# Patient Record
Sex: Male | Born: 1960
Health system: Southern US, Community
[De-identification: ages and names within clinical notes are randomized; demographics above are authoritative.]

## PROBLEM LIST (undated history)

## (undated) DIAGNOSIS — I10 Essential (primary) hypertension: Secondary | ICD-10-CM

## (undated) DIAGNOSIS — M1990 Unspecified osteoarthritis, unspecified site: Secondary | ICD-10-CM

## (undated) DIAGNOSIS — E785 Hyperlipidemia, unspecified: Secondary | ICD-10-CM

## (undated) DIAGNOSIS — E119 Type 2 diabetes mellitus without complications: Secondary | ICD-10-CM

## (undated) DIAGNOSIS — K219 Gastro-esophageal reflux disease without esophagitis: Secondary | ICD-10-CM

## (undated) HISTORY — DX: Type 2 diabetes mellitus without complications: E11.9

## (undated) HISTORY — DX: Gastro-esophageal reflux disease without esophagitis: K21.9

## (undated) HISTORY — PX: EYE SURGERY: SHX253

## (undated) HISTORY — DX: Unspecified osteoarthritis, unspecified site: M19.90

## (undated) HISTORY — DX: Essential (primary) hypertension: I10

## (undated) HISTORY — DX: Hyperlipidemia, unspecified: E78.5

## (undated) HISTORY — PX: COLONOSCOPY: SHX174

---

## 2000-05-05 ENCOUNTER — Encounter: Admission: RE | Admit: 2000-05-05 | Discharge: 2000-08-03 | Payer: Self-pay | Admitting: Psychiatry

## 2004-10-16 ENCOUNTER — Ambulatory Visit (HOSPITAL_COMMUNITY): Admission: RE | Admit: 2004-10-16 | Discharge: 2004-10-16 | Payer: Self-pay | Admitting: *Deleted

## 2006-09-14 ENCOUNTER — Encounter (HOSPITAL_COMMUNITY): Admission: RE | Admit: 2006-09-14 | Discharge: 2006-09-15 | Payer: Self-pay | Admitting: Internal Medicine

## 2009-11-05 ENCOUNTER — Encounter (INDEPENDENT_AMBULATORY_CARE_PROVIDER_SITE_OTHER): Payer: Self-pay

## 2009-11-06 ENCOUNTER — Ambulatory Visit: Payer: Self-pay | Admitting: Gastroenterology

## 2009-11-14 ENCOUNTER — Telehealth: Payer: Self-pay | Admitting: Gastroenterology

## 2009-11-23 ENCOUNTER — Ambulatory Visit: Payer: Self-pay | Admitting: Gastroenterology

## 2009-11-28 ENCOUNTER — Encounter: Payer: Self-pay | Admitting: Gastroenterology

## 2010-10-27 ENCOUNTER — Encounter: Payer: Self-pay | Admitting: Internal Medicine

## 2010-11-05 NOTE — Miscellaneous (Signed)
Summary: Lec previsit  Clinical Lists Changes  Medications: Added new medication of MOVIPREP 100 GM  SOLR (PEG-KCL-NACL-NASULF-NA ASC-C) As per prep instructions. - Signed Rx of MOVIPREP 100 GM  SOLR (PEG-KCL-NACL-NASULF-NA ASC-C) As per prep instructions.;  #1 x 0;  Signed;  Entered by: Ulis Rias RN;  Authorized by: Rachael Fee MD;  Method used: [facility] [phone]*, [address] [place], [place], [place]  [address], Ph: [phone] or [phone], Fax: [phone] Observations: Added new observation of NKA: T (11/06/2009 15:56)    Prescriptions: MOVIPREP 100 GM  SOLR (PEG-KCL-NACL-NASULF-NA ASC-C) As per prep instructions.  #1 x 0   Entered by:   Ulis Rias RN   Authorized by:   Rachael Fee MD   Signed by:   Ulis Rias RN on 11/06/2009   Method used:   [facility]ically to        [facility] [phone]* (retail)       [address]       [place], [place]  [address]       Ph: [phone] or [phone]       Fax: [phone]   RxID:   [id number]

## 2010-11-05 NOTE — Letter (Signed)
Summary: [facility] Instructions  [facility]  [address] [place], [place] [address]   Phone: [phone]  Fax: [phone]       Aaron Ferguson    [birth year]    MRN: [id number]        Procedure Day /Date: Friday 11/23/2009     Arrival Time: 8:00 am      Procedure Time: 9:00 am     Location of Procedure:                    _ x_  [facility] (4th Floor)   PREPARATION FOR COLONOSCOPY WITH MOVIPREP   Starting 5 days prior to your procedure Sunday 2/13  do not eat nuts, seeds, popcorn, corn, beans, peas,  salads, or any raw vegetables.  Do not take any fiber supplements (e.g. Metamucil, Citrucel, and Benefiber).  THE DAY BEFORE YOUR PROCEDURE         DATE: Thursday 2/17  1.  Drink clear liquids the entire day-NO SOLID FOOD  2.  Do not drink anything colored red or purple.  Avoid juices with pulp.  No orange juice.  3.  Drink at least 64 oz. (8 glasses) of fluid/clear liquids during the day to prevent dehydration and help the prep work efficiently.  CLEAR LIQUIDS INCLUDE: Water Jello Ice Popsicles Tea (sugar ok, no milk/cream) Powdered fruit flavored drinks Coffee (sugar ok, no milk/cream) Gatorade Juice: apple, white grape, white cranberry  Lemonade Clear bullion, consomm, broth Carbonated beverages (any kind) Strained chicken noodle soup Hard Candy                             4.  In the morning, mix first dose of MoviPrep solution:    Empty 1 Pouch A and 1 Pouch B into the disposable container    Add lukewarm drinking water to the top line of the container. Mix to dissolve    Refrigerate (mixed solution should be used within 24 hrs)  5.  Begin drinking the prep at 5:00 p.m. The MoviPrep container is divided by 4 marks.   Every 15 minutes drink the solution down to the next mark (approximately 8 oz) until the full liter is complete.   6.  Follow completed prep with 16 oz of clear liquid of your choice (Nothing red or purple).   Continue to drink clear liquids until bedtime.  7.  Before going to bed, mix second dose of MoviPrep solution:    Empty 1 Pouch A and 1 Pouch B into the disposable container    Add lukewarm drinking water to the top line of the container. Mix to dissolve    Refrigerate  THE DAY OF YOUR PROCEDURE      DATE: Friday 2/18  Beginning at 4:00 am  (5 hours before procedure):         1. Every 15 minutes, drink the solution down to the next mark (approx 8 oz) until the full liter is complete.  2. Follow completed prep with 16 oz. of clear liquid of your choice.    3. You may drink clear liquids until 7:00 am (2 HOURS BEFORE PROCEDURE).   MEDICATION INSTRUCTIONS  Unless otherwise instructed, you should take regular prescription medications with a small sip of water   as early as possible the morning of your procedure.          OTHER INSTRUCTIONS  You will need a responsible adult  at least 52 years of age to accompany you and drive you home.   This person must remain in the waiting room during your procedure.  Wear loose fitting clothing that is easily removed.  Leave jewelry and other valuables at home.  However, you may wish to bring a book to read or  an iPod/MP3 player to listen to music as you wait for your procedure to start.  Remove all body piercing jewelry and leave at home.  Total time from sign-in until discharge is approximately 2-3 hours.  You should go home directly after your procedure and rest.  You can resume normal activities the  day after your procedure.  The day of your procedure you should not:   Drive   Make legal decisions   Operate machinery   Drink alcohol   Return to work  You will receive specific instructions about eating, activities and medications before you leave.    The above instructions have been reviewed and explained to me by   Ulis Rias RN  November 06, 2009 4:11 PM    I fully understand and can verbalize these  instructions _____________________________ Date _________

## 2010-11-05 NOTE — Letter (Signed)
Summary: Results Letter   Gastroenterology  [address] [place], [place] [address]   Phone: [phone]  Fax: [phone]        November 28, 2009 MRN: [id number]    Aaron Ferguson [address] [facility] [address] [place], [place]  [address]    Dear [name],   Good news.  The polyp(s) that were removed during your recent procedure were NOT pre-cancerous.  You should continue to follow current colorectal cancer screening guidelines with a repeat colonoscopy in 10 years.  We will therefore put your information in our reminder system and will contact you in 10 years to schedule a repeat procedure.  Please call if you have any questions or concerns.       Sincerely,  Rachael Fee MD  This letter has been electronically signed by your physician.  Appended Document: Results Letter  Letter mailed 2.24.11

## 2010-11-05 NOTE — Progress Notes (Signed)
Summary: Re send prep meds to RX  Phone Note Call from Patient Call back at Work Phone [phone]   Call For: Dr Christella Hartigan Summary of Call[facility] [place] [facility] said they did not get the order for his prep meds. Wonders if we can re send. Initial call taken by: Leanor Kail [facility],  November 14, 2009 12:50 PM  Follow-up for Phone Call        Spoke with pharmacist.  Rx had been filed in wrong pts. chart on their end.  Correction made by Lorin Picket. Follow-up by: Wyona Almas RN,  November 14, 2009 3:49 PM

## 2010-11-05 NOTE — Procedures (Signed)
Summary: Colonoscopy  Patient: Aaron Ferguson Note: All result statuses are Final unless otherwise noted.  Tests: (1) Colonoscopy (COL)   COL Colonoscopy           DONE     [facility]     [address]     [place], [place]  [address]           COLONOSCOPY PROCEDURE REPORT           PATIENT:  [name], [name]  MR#:  [id number]     BIRTHDATE:  07-10-1961, 48 yrs. old  GENDER:  male     ENDOSCOPIST:  Rachael Fee, MD     Referred by:  Ellamae Sia, M.D.     PROCEDURE DATE:  11/23/2009     PROCEDURE:  Colonoscopy with biopsy     ASA CLASS:  Class II     INDICATIONS:  Routine Risk Screening     MEDICATIONS:   Fentanyl 75 mcg IV, Versed 9 mg IV     DESCRIPTION OF PROCEDURE:   After the risks benefits and     alternatives of the procedure were thoroughly explained, informed     consent was obtained.  No rectal exam performed. The LB CF-[id number]     [id number] endoscope was introduced through the anus and advanced to     the cecum, which was identified by both the appendix and ileocecal     valve, without limitations.  The quality of the prep was adequate,     using MoviPrep.  The instrument was then slowly withdrawn as the     colon was fully examined.     <<PROCEDUREIMAGES>>           FINDINGS:  A sessile polyp was found in the sigmoid colon. This     was 2mm, removed with forceps and sent to pathology (jar 1) (see     image4).  This was otherwise a normal examination of the colon     (see image2, image3, and image5).   Retroflexed views in the     rectum revealed no abnormalities.    The scope was then withdrawn     from the patient and the procedure completed.     COMPLICATIONS:  None           ENDOSCOPIC IMPRESSION:     1) Small sessile polyp in the sigmoid colon, removed and sent to     pathology     2) Otherwise normal examination           RECOMMENDATIONS:     1) If the polyp(s) removed today are proven to be adenomatous     (pre-cancerous) polyps, you will  need a repeat colonoscopy in 5     years. Otherwise you should continue to follow colorectal cancer     screening guidelines for "routine risk" patients with colonoscopy     in 10 years.     2) You will receive a letter within 1-2 weeks with the results     of your biopsy as well as final recommendations. Please call my     office if you have not received a letter after 3 weeks.           ______________________________     Rachael Fee, MD           n.     eSIGNED:   Rachael Fee at 11/23/2009 09:21 AM  [name], [name], [phone]  Note: An exclamation mark (!) indicates a result that was not dispersed into the flowsheet. Document Creation Date: 11/23/2009 9:21 AM _______________________________________________________________________  (1) Order result status: Final Collection or observation date-time: 11/23/2009 09:17 Requested date-time:  Receipt date-time:  Reported date-time:  Referring Physician:   Ordering Physician: Rob Bunting [phone]) Specimen Source:  Source: Launa Grill Order Number: [id number] Lab site:   Appended Document: Colonoscopy     Procedures Next Due Date:    Colonoscopy: 12/2019

## 2013-01-09 ENCOUNTER — Ambulatory Visit (INDEPENDENT_AMBULATORY_CARE_PROVIDER_SITE_OTHER): Payer: BC Managed Care – PPO | Admitting: Internal Medicine

## 2013-01-09 VITALS — BP 118/92 | HR 88 | Temp 98.3°F | Resp 16 | Ht 70.0 in | Wt 220.0 lb

## 2013-01-09 DIAGNOSIS — E119 Type 2 diabetes mellitus without complications: Secondary | ICD-10-CM

## 2013-01-09 DIAGNOSIS — Z6834 Body mass index (BMI) 34.0-34.9, adult: Secondary | ICD-10-CM | POA: Insufficient documentation

## 2013-01-09 DIAGNOSIS — R350 Frequency of micturition: Secondary | ICD-10-CM

## 2013-01-09 DIAGNOSIS — E785 Hyperlipidemia, unspecified: Secondary | ICD-10-CM | POA: Insufficient documentation

## 2013-01-09 DIAGNOSIS — Z0000 Encounter for general adult medical examination without abnormal findings: Secondary | ICD-10-CM

## 2013-01-09 DIAGNOSIS — Z6831 Body mass index (BMI) 31.0-31.9, adult: Secondary | ICD-10-CM

## 2013-01-09 DIAGNOSIS — R739 Hyperglycemia, unspecified: Secondary | ICD-10-CM

## 2013-01-09 DIAGNOSIS — K219 Gastro-esophageal reflux disease without esophagitis: Secondary | ICD-10-CM | POA: Insufficient documentation

## 2013-01-09 LAB — POCT UA - MICROSCOPIC ONLY
Casts, Ur, LPF, POC: NEGATIVE
Yeast, UA: NEGATIVE

## 2013-01-09 LAB — POCT URINALYSIS DIPSTICK
Bilirubin, UA: NEGATIVE
Ketones, UA: 15
Leukocytes, UA: NEGATIVE
Nitrite, UA: NEGATIVE
pH, UA: 5

## 2013-01-09 LAB — POCT CBC
Granulocyte percent: 56.1 %G (ref 37–80)
HCT, POC: 47.5 % (ref 43.5–53.7)
MCH, POC: 27.9 pg (ref 27–31.2)
MCHC: 32.6 g/dL (ref 31.8–35.4)
MPV: 10.7 fL (ref 0–99.8)
POC Granulocyte: 3.3 (ref 2–6.9)
POC LYMPH PERCENT: 37.2 %L (ref 10–50)

## 2013-01-09 LAB — COMPREHENSIVE METABOLIC PANEL
AST: 22 U/L (ref 0–37)
Alkaline Phosphatase: 61 U/L (ref 39–117)
BUN: 12 mg/dL (ref 6–23)
CO2: 23 mEq/L (ref 19–32)
Chloride: 102 mEq/L (ref 96–112)
Creat: 1.15 mg/dL (ref 0.50–1.35)
Glucose, Bld: 330 mg/dL — ABNORMAL HIGH (ref 70–99)
Total Bilirubin: 0.8 mg/dL (ref 0.3–1.2)
Total Protein: 7.7 g/dL (ref 6.0–8.3)

## 2013-01-09 LAB — IFOBT (OCCULT BLOOD): IFOBT: NEGATIVE

## 2013-01-09 LAB — LIPID PANEL
Cholesterol: 264 mg/dL — ABNORMAL HIGH (ref 0–200)
VLDL: 16 mg/dL (ref 0–40)

## 2013-01-09 LAB — POCT GLYCOSYLATED HEMOGLOBIN (HGB A1C): Hemoglobin A1C: 14

## 2013-01-09 MED ORDER — METFORMIN HCL ER 500 MG PO TB24: 500.0000 mg | ORAL_TABLET | Freq: Every day | ORAL | Status: DC

## 2013-01-09 MED ORDER — LISINOPRIL 5 MG PO TABS: 5.0000 mg | ORAL_TABLET | Freq: Every day | ORAL | Status: DC

## 2013-01-09 MED ORDER — ATORVASTATIN CALCIUM 20 MG PO TABS: 20.0000 mg | ORAL_TABLET | Freq: Every day | ORAL | Status: DC

## 2013-01-09 NOTE — Patient Instructions (Addendum)
Diabetes Meal Planning Guide The diabetes meal planning guide is a tool to help you plan your meals and snacks. It is important for people with diabetes to manage their blood glucose (sugar) levels. Choosing the right foods and the right amounts throughout your day will help control your blood glucose. Eating right can even help you improve your blood pressure and reach or maintain a healthy weight. CARBOHYDRATE COUNTING MADE EASY When you eat carbohydrates, they turn to sugar. This raises your blood glucose level. Counting carbohydrates can help you control this level so you feel better. When you plan your meals by counting carbohydrates, you can have more flexibility in what you eat and balance your medicine with your food intake. Carbohydrate counting simply means adding up the total amount of carbohydrate grams in your meals and snacks. Try to eat about the same amount at each meal. Foods with carbohydrates are listed below. Each portion below is 1 carbohydrate serving or 15 grams of carbohydrates. Ask your dietician how many grams of carbohydrates you should eat at each meal or snack. Grains and Starches  1 slice bread.   English muffin or hotdog/hamburger bun.   cup cold cereal (unsweetened).   cup cooked pasta or rice.   cup starchy vegetables (corn, potatoes, peas, beans, winter squash).  1 tortilla (6 inches).   bagel.  1 waffle or pancake (size of a CD).   cup cooked cereal.  4 to 6 small crackers. *Whole grain is recommended. Fruit  1 cup fresh unsweetened berries, melon, papaya, pineapple.  1 small fresh fruit.   banana or mango.   cup fruit juice (4 oz unsweetened).   cup canned fruit in natural juice or water.  2 tbs dried fruit.  12 to 15 grapes or cherries. Milk and Yogurt  1 cup fat-free or 1% milk.  1 cup soy milk.  6 oz light yogurt with sugar-free sweetener.  6 oz low-fat soy yogurt.  6 oz plain yogurt. Vegetables  1 cup raw or  cup  cooked is counted as 0 carbohydrates or a "free" food.  If you eat 3 or more servings at 1 meal, count them as 1 carbohydrate serving. Other Carbohydrates   oz chips or pretzels.   cup ice cream or frozen yogurt.   cup sherbet or sorbet.  2 inch square cake, no frosting.  1 tbs honey, sugar, jam, jelly, or syrup.  2 small cookies.  3 squares of graham crackers.  3 cups popcorn.  6 crackers.  1 cup broth-based soup.  Count 1 cup casserole or other mixed foods as 2 carbohydrate servings.  Foods with less than 20 calories in a serving may be counted as 0 carbohydrates or a "free" food. You may want to purchase a book or computer software that lists the carbohydrate gram counts of different foods. In addition, the nutrition facts panel on the labels of the foods you eat are a good source of this information. The label will tell you how big the serving size is and the total number of carbohydrate grams you will be eating per serving. Divide this number by 15 to obtain the number of carbohydrate servings in a portion. Remember, 1 carbohydrate serving equals 15 grams of carbohydrate. SERVING SIZES Measuring foods and serving sizes helps you make sure you are getting the right amount of food. The list below tells how big or small some common serving sizes are.  1 oz.........4 stacked dice.  3 oz.........Deck of cards.  1 tsp........Tip   of little finger.  1 tbs......Marland KitchenMarland KitchenThumb.  2 tbs.......Marland KitchenGolf ball.   cup......Marland KitchenHalf of a fist.  1 cup.......Marland KitchenA fist. SAMPLE DIABETES MEAL PLAN Below is a sample meal plan that includes foods from the grain and starches, dairy, vegetable, fruit, and meat groups. A dietician can individualize a meal plan to fit your calorie needs and tell you the number of servings needed from each food group. However, controlling the total amount of carbohydrates in your meal or snack is more important than making sure you include all of the food groups at every  meal. You may interchange carbohydrate containing foods (dairy, starches, and fruits). The meal plan below is an example of a 2000 calorie diet using carbohydrate counting. This meal plan has 17 carbohydrate servings. Breakfast  1 cup oatmeal (2 carb servings).   cup light yogurt (1 carb serving).  1 cup blueberries (1 carb serving).   cup almonds. Snack  1 large apple (2 carb servings).  1 low-fat string cheese stick. Lunch  Chicken breast salad.  1 cup spinach.   cup chopped tomatoes.  2 oz chicken breast, sliced.  2 tbs low-fat [place] dressing.  12 whole-wheat crackers (2 carb servings).  12 to 15 grapes (1 carb serving).  1 cup low-fat milk (1 carb serving). Snack  1 cup carrots.   cup hummus (1 carb serving). Dinner  3 oz broiled salmon.  1 cup [place] rice (3 carb servings). Snack  1  cups steamed broccoli (1 carb serving) drizzled with 1 tsp olive oil and lemon juice.  1 cup light pudding (2 carb servings). DIABETES MEAL PLANNING WORKSHEET Your dietician can use this worksheet to help you decide how many servings of foods and what types of foods are right for you.  BREAKFAST Food Group and Servings / Carb Servings Grain/Starches __________________________________ Dairy __________________________________________ Vegetable ______________________________________ Fruit ___________________________________________ Meat __________________________________________ Fat ____________________________________________ LUNCH Food Group and Servings / Carb Servings Grain/Starches ___________________________________ Dairy ___________________________________________ Fruit ____________________________________________ Meat ___________________________________________ Fat _____________________________________________ Laural Golden Food Group and Servings / Carb Servings Grain/Starches ___________________________________ Dairy  ___________________________________________ Fruit ____________________________________________ Meat ___________________________________________ Fat _____________________________________________ SNACKS Food Group and Servings / Carb Servings Grain/Starches ___________________________________ Dairy ___________________________________________ Vegetable _______________________________________ Fruit ____________________________________________ Meat ___________________________________________ Fat _____________________________________________ DAILY TOTALS Starches _________________________ Vegetable ________________________ Fruit ____________________________ Dairy ____________________________ Meat ____________________________ Fat ______________________________ Document Released: 06/19/2005 Document Revised: 12/15/2011 Document Reviewed: 04/30/2009 [employer] Patient Information 2013 [place], [place].   Diabetes and Exercise Regular exercise is important and can help:   Control blood glucose (sugar).  Decrease blood pressure.    Control blood lipids (cholesterol, triglycerides).  Improve overall health. BENEFITS FROM EXERCISE  Improved fitness.  Improved flexibility.  Improved endurance.  Increased bone density.  Weight control.  Increased muscle strength.  Decreased body fat.  Improvement of the body's use of insulin, a hormone.  Increased insulin sensitivity.  Reduction of insulin needs.  Reduced stress and tension.  Helps you feel better. People with diabetes who add exercise to their lifestyle gain additional benefits, including:  Weight loss.  Reduced appetite.  Improvement of the body's use of blood glucose.  Decreased risk factors for heart disease:  Lowering of cholesterol and triglycerides.  Raising the level of good cholesterol (high-density lipoproteins, HDL).  Lowering blood sugar.  Decreased blood pressure. TYPE 1 DIABETES AND  EXERCISE  Exercise will usually lower your blood glucose.  If blood glucose is greater than 240 mg/dl, check urine ketones. If ketones are present, do not exercise.  Location of the insulin injection sites may need to be  adjusted with exercise. Avoid injecting insulin into areas of the body that will be exercised. For example, avoid injecting insulin into:  The arms when playing tennis.  The legs when jogging. For more information, discuss this with your caregiver.  Keep a record of:  Food intake.  Type and amount of exercise.  Expected peak times of insulin action.  Blood glucose levels. Do this before, during, and after exercise. Review your records with your caregiver. This will help you to develop guidelines for adjusting food intake and insulin amounts.  TYPE 2 DIABETES AND EXERCISE  Regular physical activity can help control blood glucose.  Exercise is important because it may:  Increase the body's sensitivity to insulin.  Improve blood glucose control.  Exercise reduces the risk of heart disease. It decreases serum cholesterol and triglycerides. It also lowers blood pressure.  Those who take insulin or oral hypoglycemic agents should watch for signs of hypoglycemia. These signs include dizziness, shaking, sweating, chills, and confusion.  Body water is lost during exercise. It must be replaced. This will help to avoid loss of body fluids (dehydration) or heat stroke. Be sure to talk to your caregiver before starting an exercise program to make sure it is safe for you. Remember, any activity is better than none.  Document Released: 12/13/2003 Document Revised: 12/15/2011 Document Reviewed: 03/29/2009 [facility] Patient Information 2013 [place], [place].   Diabetes, Eating Away From Home Sometimes, you might eat in a restaurant or have meals that are prepared by someone else. You can enjoy eating out. However, the portions in restaurants may be much larger than needed.  Listed below are some ideas to help you choose foods that will keep your blood glucose (sugar) in better control.  TIPS FOR EATING OUT  Know your meal plan and how many carbohydrate servings you should have at each meal. You may wish to carry a copy of your meal plan in your purse or wallet. Learn the foods included in each food group.  Make a list of restaurants near you that offer healthy choices. Take a copy of the carry-out menus to see what they offer. Then, you can plan what you will order ahead of time.  Become familiar with serving sizes by practicing them at home using measuring cups and spoons. Once you learn to recognize portion sizes, you will be able to correctly estimate the amount of total carbohydrate you are allowed to eat at the restaurant. Ask for a takeout box if the portion is more than you should have. When your food comes, leave the amount you should have on the plate, and put the rest in the takeout box before you start eating.  Plan ahead if your mealtime will be different from usual. Check with your caregiver to find out how to time meals and medicine if you are taking insulin.  Avoid high-fat foods, such as fried foods, cream sauces, high-fat salad dressings, or any added butter or margarine.  Do not be afraid to ask questions. Ask your server about the portion size, cooking methods, ingredients and if items can be substituted. Restaurants do not list all available items on the menu. You can ask for your main entree to be prepared using skim milk, oil instead of butter or margarine, and without gravy or sauces. Ask your waiter or waitress to serve salad dressings, gravy, sauces, margarine, and sour cream on the side. You can then add the amount your meal plan suggests.  Add more vegetables whenever possible.  Avoid  items that are labeled "jumbo," "giant," "deluxe," or "supersized."  You may want to split an entre with someone and order an extra side salad.  Watch for  hidden calories in foods like croutons, bacon, or cheese.  Ask your server to take away the bread basket or chips from your table.  Order a dinner salad as an appetizer. You can eat most foods served in a restaurant. Some foods are better choices than others. Breads and Starches  Recommended: All kinds of bread (wheat, rye, white, oatmeal, [place], [place], raisin), hard or soft dinner rolls, frankfurter or hamburger buns, small bagels, small corn or whole-wheat flour tortillas.  Avoid: Frosted or glazed breads, butter rolls, egg or cheese breads, croissants, sweet rolls, pastries, coffee cake, glazed or frosted doughnuts, muffins. Crackers  Recommended: Animal crackers, graham, rye, saltine, oyster, and matzoth crackers. Bread sticks, melba toast, rusks, pretzels, popcorn (without fat), zwieback toast.  Avoid: High-fat snack crackers or chips. Buttered popcorn. Cereals  Recommended: Hot and cold cereals. Whole grains such as oatmeal or shredded wheat are good choices.  Avoid: Sugar-coated or granola type cereals. Potatoes/Pasta/Rice/Beans  Recommended: Order baked, boiled, or mashed potatoes, rice or noodles without added fat, whole beans. Order gravies, butter, margarine, or sauces on the side so you can control the amount you add.  Avoid: Hash browns or fried potatoes. Potatoes, pasta, or rice prepared with cream or cheese sauce. Potato or pasta salads prepared with large amounts of dressing. Fried beans or fried rice. Vegetables  Recommended: Order steamed, baked, boiled, or stewed vegetables without sauces or extra fat. Ask that sauce be served on the side. If vegetables are not listed on the menu, ask what is available.  Avoid: Vegetables prepared with cream, butter, or cheese sauce. Fried vegetables. Salad Bars  Recommended: Many of the vegetables at a salad bar are considered "free." Use lemon juice, vinegar, or low-calorie salad dressing (fewer than 20 calories per serving)  as "free" dressings for your salad. Look for salad bar ingredients that have no added fat or sugar such as tomatoes, lettuce, cucumbers, broccoli, carrots, onions, and mushrooms.  Avoid: Prepared salads with large amounts of dressing, such as coleslaw, caesar salad, macaroni salad, bean salad, or carrot salad. Fruit  Recommended: Eat fresh fruit or fresh fruit salad without added dressing. A salad bar often offers fresh fruit choices, but canned fruit at a restaurant is usually packed in sugar or syrup.  Avoid: Sweetened canned or frozen fruits, plain or sweetened fruit juice. Fruit salads with dressing, sour cream, or sugar added to them. Meat and Meat Substitutes  Recommended: Order broiled, baked, roasted, or grilled meat, poultry, or fish. Trim off all visible fat. Do not eat the skin of poultry. The size stated on the menu is the raw weight. Meat shrinks by  in cooking (for example, 4 oz raw equals 3 oz cooked meat).  Avoid: Deep-fat fried meat, poultry, or fish. Breaded meats. Eggs  Recommended: Order soft, hard-cooked, poached, or scrambled eggs. Omelets may be okay, depending on what ingredients are added. Egg substitutes are also a good choice.  Avoid: Fried eggs, eggs prepared with cream or cheese sauce. Milk  Recommended: Order low-fat or fat-free milk according to your meal plan. Plain, nonfat yogurt or flavored yogurt with no sugar added may be used as a substitute for milk. Soy milk may also be used.  Avoid: Milk shakes or sweetened milk beverages. Soups and Combination Foods  Recommended: Clear broth or consomm are "free" foods and  may be used as an Medical sales representative. Broth-based soups with fat removed count as a starch serving and are preferred over cream soups. Soups made with beans or split peas may be eaten but count as a starch.  Avoid: Fatty soups, soup made with cream, cheese soup. Combination foods prepared with excessive amounts of fat or with cream or cheese  sauces. Desserts and Sweets  Recommended: Ask for fresh fruit. Sponge or angel food cake without icing, ice milk, no sugar added ice cream, sherbet, or frozen yogurt may fit into your meal plan occasionally.  Avoid: Pastries, puddings, pies, cakes with icing, custard, gelatin desserts. Fats and Oils  Recommended: Choose healthy fats such as olive oil, canola oil, or tub margarine, reduced fat or fat-free sour cream, cream cheese, avocado, or nuts.  Avoid: Any fats in excess of your allowed portion. Deep-fried foods or any food with a large amount of fat. Note: Ask for all fats to be served on the side, and limit your portion sizes according to your meal plan. Document Released: 09/22/2005 Document Revised: 12/15/2011 Document Reviewed: 04/12/2009 [facility] Patient Information 2013 [place], [place].

## 2013-01-09 NOTE — Progress Notes (Signed)
Subjective:    Patient ID: Aaron Ferguson, male    DOB: [birth year], 52 y.o.   MRN: [id number]  HPI 52 yo male patient comes in today for an annual exam. Patient has been doing well. Patient reports some weight loss because of better diet and He has cut sweets out of his diet for [place]. He was exercising with walking, bowling 2-3 nights a week. He has a state tournament next weekend.   Urinary frequency at night: He has been getting up 4-5 times a night to use the restroom. He has not had an increase in fluids before bedtime. He also reports that he urinates more during the day. He does not have trouble with starting urinating and it does not stop abruptly. He states he is more thirsty than in past.  He has not been getting regular sleep due to his schedule. He has not been experiencing abnormal fatigue.  He states that he has to use some reading glasses in the morning. He notices that his right eye is slightly changed from his normal.   Family history-mother now deceased had diabetes Social history-married to Margaret/doing well/continues to be very active in church and community support Past medical history relatively insignificant Immunizations up-to-date Colonoscopy up-to-date No recent vision exam  Review of Systems HEENT all negative No chest pain palpitations edema No shortness of breath cough or nocturnal breathing problems No snoring Good appetite/no abdominal pain constipation or diarrhea Colonoscopy 2010 within normal limits No genitourinary symptoms other than urinary frequency No bony joint problems Neurological negative    Objective:   Physical Exam BP 118/92  Pulse 88  Temp(Src) 98.3 F (36.8 C) (Oral)  Resp 16  Ht 5\' 10"  (1.778 m)  Wt 220 lb (99.791 kg)  BMI 31.57 kg/m2  SpO2 98% No acute distress HEENT clear including no thyromegaly or lymphadenopathy Heart regular without murmur No carotid bruits Lungs clear Abdomen supple without organomegaly Prostate  soft and symmetrical/stool Hemosure sent to lab Extremities clear Neurological intact Psychiatric stable   Results for orders placed in visit on 01/09/13  POCT CBC      Result Value Range   WBC 5.8  4.6 - 10.2 K/uL   Lymph, poc 2.2  0.6 - 3.4   POC LYMPH PERCENT 37.2  10 - 50 %L   MID (cbc) 0.4  0 - 0.9   POC MID % 6.7  0 - 12 %M   POC Granulocyte 3.3  2 - 6.9   Granulocyte percent 56.1  37 - 80 %G   RBC 5.56  4.69 - 6.13 M/uL   Hemoglobin 15.5  14.1 - 18.1 g/dL   HCT, POC 08.6  57.8 - 53.7 %   MCV 85.5  80 - 97 fL   MCH, POC 27.9  27 - 31.2 pg   MCHC 32.6  31.8 - 35.4 g/dL   RDW, POC 46.9     Platelet Count, POC 218  142 - 424 K/uL   MPV 10.7  0 - 99.8 fL  POCT GLYCOSYLATED HEMOGLOBIN (HGB A1C)      Result Value Range   Hemoglobin A1C 14.0    POCT URINALYSIS DIPSTICK      Result Value Range   Color, UA yellow     Clarity, UA clear     Glucose, UA 500     Bilirubin, UA neg     Ketones, UA 15     Spec Grav, UA 1.010     Blood, UA neg  pH, UA 5.0     Protein, UA trace     Urobilinogen, UA 0.2     Nitrite, UA neg     Leukocytes, UA Negative    POCT UA - MICROSCOPIC ONLY      Result Value Range   WBC, Ur, HPF, POC 0-1     RBC, urine, microscopic neg     Bacteria, U Microscopic neg     Mucus, UA trace     Epithelial cells, urine per micros 0-3     Crystals, Ur, HPF, POC neg     Casts, Ur, LPF, POC neg     Yeast, UA neg         Assessment & Plan:  Annual physical examination Problem #1 new onset diabetes Discussed diet, exercise, long-term issues with diabetes , treatment with metformin lisinopril and statin   Meds ordered this encounter  Medications  . fish oil-omega-3 fatty acids 1000 MG capsule    Sig: Take 1 g by mouth daily.  Marland Kitchen lisinopril (PRINIVIL,ZESTRIL) 5 MG tablet    Sig: Take 1 tablet (5 mg total) by mouth daily.    Dispense:  90 tablet    Refill:  3  . atorvastatin (LIPITOR) 20 MG tablet    Sig: Take 1 tablet (20 mg total) by mouth daily.     Dispense:  90 tablet    Refill:  3  . metFORMIN (GLUCOPHAGE XR) 500 MG 24 hr tablet    Sig: Take 1 tablet (500 mg total) by mouth daily with breakfast.    Dispense:  90 tablet    Refill:  3   Recheck fasting blood sugar in one month

## 2013-01-10 ENCOUNTER — Encounter: Payer: Self-pay | Admitting: Internal Medicine

## 2013-01-10 DIAGNOSIS — E119 Type 2 diabetes mellitus without complications: Secondary | ICD-10-CM | POA: Insufficient documentation

## 2013-01-10 LAB — PSA: PSA: 0.4 ng/mL (ref <=4.00)

## 2013-01-11 NOTE — Progress Notes (Signed)
Pt prefers to walk in on the weekend due to work. He is aware that he needs a fasting ov in the next month or so. Aaron Ferguson

## 2013-02-14 ENCOUNTER — Ambulatory Visit (INDEPENDENT_AMBULATORY_CARE_PROVIDER_SITE_OTHER): Payer: BC Managed Care – PPO | Admitting: Internal Medicine

## 2013-02-14 VITALS — BP 112/78 | HR 86 | Temp 98.5°F | Resp 16 | Ht 71.0 in | Wt 220.0 lb

## 2013-02-14 DIAGNOSIS — E1165 Type 2 diabetes mellitus with hyperglycemia: Secondary | ICD-10-CM

## 2013-02-14 DIAGNOSIS — E119 Type 2 diabetes mellitus without complications: Secondary | ICD-10-CM

## 2013-02-14 LAB — GLUCOSE, POCT (MANUAL RESULT ENTRY): POC Glucose: 98 mg/dl (ref 70–99)

## 2013-02-14 NOTE — Patient Instructions (Addendum)
Recheck around mid july

## 2013-02-14 NOTE — Progress Notes (Signed)
  Subjective:    Patient ID: Aaron Ferguson, male    DOB: 26-Aug-1961, 52 y.o.   MRN: [id number]  HPIf/u new onset DM Eye exam=ref to retina spec ?holes   AR 1st time -resp to [facility]  Review of Systems noncontrib    Objective:   Physical Exam BP 112/78  Pulse 86  Temp(Src) 98.5 F (36.9 C) (Oral)  Resp 16  Ht 5\' 11"  (1.803 m)  Wt 220 lb (99.791 kg)  BMI 30.7 kg/m2  SpO2 98%    Results for orders placed in visit on 02/14/13  GLUCOSE, POCT (MANUAL RESULT ENTRY)      Result Value Range   POC Glucose 98  70 - 99 mg/dl       Assessment & Plan:  DM responding well to meds/diet  F/u 2 mos for full labs

## 2013-02-14 NOTE — Progress Notes (Signed)
  Subjective:    Patient ID: Aaron Ferguson, male    DOB: [birth year], 52 y.o.   MRN: [id number]  HPI    Review of Systems     Objective:   Physical Exam    Results for orders placed in visit on 02/14/13  GLUCOSE, POCT (MANUAL RESULT ENTRY)      Result Value Range   POC Glucose 98  70 - 99 mg/dl       Assessment & Plan:

## 2013-06-21 ENCOUNTER — Ambulatory Visit (INDEPENDENT_AMBULATORY_CARE_PROVIDER_SITE_OTHER): Payer: BC Managed Care – PPO | Admitting: Family Medicine

## 2013-06-21 VITALS — BP 122/74 | HR 74 | Temp 97.9°F | Resp 18 | Ht 70.5 in | Wt 232.0 lb

## 2013-06-21 DIAGNOSIS — E119 Type 2 diabetes mellitus without complications: Secondary | ICD-10-CM

## 2013-06-21 DIAGNOSIS — E785 Hyperlipidemia, unspecified: Secondary | ICD-10-CM

## 2013-06-21 LAB — LIPID PANEL
Cholesterol: 146 mg/dL (ref 0–200)
HDL: 44 mg/dL (ref >39)
LDL Cholesterol: 91 mg/dL (ref 0–99)
Total CHOL/HDL Ratio: 3.3 Ratio
Triglycerides: 55 mg/dL (ref <150)
VLDL: 11 mg/dL (ref 0–40)

## 2013-06-21 LAB — COMPREHENSIVE METABOLIC PANEL WITH GFR
AST: 37 U/L (ref 0–37)
Albumin: 4.4 g/dL (ref 3.5–5.2)
Alkaline Phosphatase: 44 U/L (ref 39–117)
Calcium: 9.5 mg/dL (ref 8.4–10.5)
Chloride: 104 meq/L (ref 96–112)
Glucose, Bld: 100 mg/dL — ABNORMAL HIGH (ref 70–99)
Potassium: 4.5 meq/L (ref 3.5–5.3)
Sodium: 137 meq/L (ref 135–145)
Total Protein: 7.1 g/dL (ref 6.0–8.3)

## 2013-06-21 LAB — COMPREHENSIVE METABOLIC PANEL
ALT: 42 U/L (ref 0–53)
BUN: 10 mg/dL (ref 6–23)
CO2: 27 mEq/L (ref 19–32)
Creat: 1.07 mg/dL (ref 0.50–1.35)
Total Bilirubin: 0.7 mg/dL (ref 0.3–1.2)

## 2013-06-21 LAB — POCT GLYCOSYLATED HEMOGLOBIN (HGB A1C): Hemoglobin A1C: 6.3

## 2013-06-21 NOTE — Patient Instructions (Addendum)
Diabetes and Foot Care Diabetes may cause you to have a poor blood supply (circulation) to your legs and feet. Because of this, the skin may be thinner, break easier, and heal more slowly. You also may have nerve damage in your legs and feet causing decreased feeling. You may not notice minor injuries to your feet that could lead to serious problems or infections. Taking care of your feet is one of the most important things you can do for yourself.  HOME CARE INSTRUCTIONS  Do not go barefoot. Bare feet are easily injured.  Check your feet daily for blisters, cuts, and redness.  Wash your feet with warm water (not hot) and mild soap. Pat your feet and between your toes until completely dry.  Apply a moisturizing lotion that does not contain alcohol or petroleum jelly to the dry skin on your feet and to dry brittle toenails. Do not put it between your toes.  Trim your toenails straight across. Do not dig under them or around the cuticle.  Do not cut corns or calluses, or try to remove them with medicine.  Wear clean cotton socks or stockings every day. Make sure they are not too tight. Do not wear knee high stockings since they may decrease blood flow to your legs.  Wear leather shoes that fit properly and have enough cushioning. To break in new shoes, wear them just a few hours a day to avoid injuring your feet.  Wear shoes at all times, even in the house.  Do not cross your legs. This may decrease the blood flow to your feet.  If you find a minor scrape, cut, or break in the skin on your feet, keep it and the skin around it clean and dry. These areas may be cleansed with mild soap and water. Do not use peroxide, alcohol, iodine or Merthiolate.  When you remove an adhesive bandage, be sure not to harm the skin around it.  If you have a wound, look at it several times a day to make sure it is healing.  Do not use heating pads or hot water bottles. Burns can occur. If you have lost feeling  in your feet or legs, you may not know it is happening until it is too late.  Report any cuts, sores or bruises to your caregiver. Do not wait! SEEK MEDICAL CARE IF:   You have an injury that is not healing or you notice redness, numbness, burning, or tingling.  Your feet always feel cold.  You have pain or cramps in your legs and feet. SEEK IMMEDIATE MEDICAL CARE IF:   There is increasing redness, swelling, or increasing pain in the wound.  There is a red line that goes up your leg.  Pus is coming from a wound.  You develop an unexplained oral temperature above 102 F (38.9 C), or as your caregiver suggests.  You notice a bad smell coming from an ulcer or wound. MAKE SURE YOU:   Understand these instructions.  Will watch your condition.  Will get help right away if you are not doing well or get worse. Document Released: 09/19/2000 Document Revised: 12/15/2011 Document Reviewed: 03/28/2009 [facility] Patient Information 2014 [place], [place]. Diabetes Meal Planning Guide The diabetes meal planning guide is a tool to help you plan your meals and snacks. It is important for people with diabetes to manage their blood glucose (sugar) levels. Choosing the right foods and the right amounts throughout your day will help control your blood glucose.  Eating right can even help you improve your blood pressure and reach or maintain a healthy weight. CARBOHYDRATE COUNTING MADE EASY When you eat carbohydrates, they turn to sugar. This raises your blood glucose level. Counting carbohydrates can help you control this level so you feel better. When you plan your meals by counting carbohydrates, you can have more flexibility in what you eat and balance your medicine with your food intake. Carbohydrate counting simply means adding up the total amount of carbohydrate grams in your meals and snacks. Try to eat about the same amount at each meal. Foods with carbohydrates are listed below. Each portion  below is 1 carbohydrate serving or 15 grams of carbohydrates. Ask your dietician how many grams of carbohydrates you should eat at each meal or snack. Grains and Starches  1 slice bread.   English muffin or hotdog/hamburger bun.   cup cold cereal (unsweetened).   cup cooked pasta or rice.   cup starchy vegetables (corn, potatoes, peas, beans, winter squash).  1 tortilla (6 inches).   bagel.  1 waffle or pancake (size of a CD).   cup cooked cereal.  4 to 6 small crackers. *Whole grain is recommended. Fruit  1 cup fresh unsweetened berries, melon, papaya, pineapple.  1 small fresh fruit.   banana or mango.   cup fruit juice (4 oz unsweetened).   cup canned fruit in natural juice or water.  2 tbs dried fruit.  12 to 15 grapes or cherries. Milk and Yogurt  1 cup fat-free or 1% milk.  1 cup soy milk.  6 oz light yogurt with sugar-free sweetener.  6 oz low-fat soy yogurt.  6 oz plain yogurt. Vegetables  1 cup raw or  cup cooked is counted as 0 carbohydrates or a "free" food.  If you eat 3 or more servings at 1 meal, count them as 1 carbohydrate serving. Other Carbohydrates   oz chips or pretzels.   cup ice cream or frozen yogurt.   cup sherbet or sorbet.  2 inch square cake, no frosting.  1 tbs honey, sugar, jam, jelly, or syrup.  2 small cookies.  3 squares of graham crackers.  3 cups popcorn.  6 crackers.  1 cup broth-based soup.  Count 1 cup casserole or other mixed foods as 2 carbohydrate servings.  Foods with less than 20 calories in a serving may be counted as 0 carbohydrates or a "free" food. You may want to purchase a book or computer software that lists the carbohydrate gram counts of different foods. In addition, the nutrition facts panel on the labels of the foods you eat are a good source of this information. The label will tell you how big the serving size is and the total number of carbohydrate grams you will be eating  per serving. Divide this number by 15 to obtain the number of carbohydrate servings in a portion. Remember, 1 carbohydrate serving equals 15 grams of carbohydrate. SERVING SIZES Measuring foods and serving sizes helps you make sure you are getting the right amount of food. The list below tells how big or small some common serving sizes are.  1 oz.........4 stacked dice.  3 oz........Marland KitchenDeck of cards.  1 tsp.......Marland KitchenTip of little finger.  1 tbs......Marland KitchenMarland KitchenThumb.  2 tbs.......Marland KitchenGolf ball.   cup......Marland KitchenHalf of a fist.  1 cup.......Marland KitchenA fist. SAMPLE DIABETES MEAL PLAN Below is a sample meal plan that includes foods from the grain and starches, dairy, vegetable, fruit, and meat groups. A dietician can individualize a meal  plan to fit your calorie needs and tell you the number of servings needed from each food group. However, controlling the total amount of carbohydrates in your meal or snack is more important than making sure you include all of the food groups at every meal. You may interchange carbohydrate containing foods (dairy, starches, and fruits). The meal plan below is an example of a 2000 calorie diet using carbohydrate counting. This meal plan has 17 carbohydrate servings. Breakfast  1 cup oatmeal (2 carb servings).   cup light yogurt (1 carb serving).  1 cup blueberries (1 carb serving).   cup almonds. Snack  1 large apple (2 carb servings).  1 low-fat string cheese stick. Lunch  Chicken breast salad.  1 cup spinach.   cup chopped tomatoes.  2 oz chicken breast, sliced.  2 tbs low-fat [place] dressing.  12 whole-wheat crackers (2 carb servings).  12 to 15 grapes (1 carb serving).  1 cup low-fat milk (1 carb serving). Snack  1 cup carrots.   cup hummus (1 carb serving). Dinner  3 oz broiled salmon.  1 cup [place] rice (3 carb servings). Snack  1  cups steamed broccoli (1 carb serving) drizzled with 1 tsp olive oil and lemon juice.  1 cup light pudding (2  carb servings). DIABETES MEAL PLANNING WORKSHEET Your dietician can use this worksheet to help you decide how many servings of foods and what types of foods are right for you.  BREAKFAST Food Group and Servings / Carb Servings Grain/Starches __________________________________ Dairy __________________________________________ Vegetable ______________________________________ Fruit ___________________________________________ Meat __________________________________________ Fat ____________________________________________ LUNCH Food Group and Servings / Carb Servings Grain/Starches ___________________________________ Dairy ___________________________________________ Fruit ____________________________________________ Meat ___________________________________________ Fat _____________________________________________ Laural Golden Food Group and Servings / Carb Servings Grain/Starches ___________________________________ Dairy ___________________________________________ Fruit ____________________________________________ Meat ___________________________________________ Fat _____________________________________________ SNACKS Food Group and Servings / Carb Servings Grain/Starches ___________________________________ Dairy ___________________________________________ Vegetable _______________________________________ Fruit ____________________________________________ Meat ___________________________________________ Fat _____________________________________________ DAILY TOTALS Starches _________________________ Vegetable ________________________ Fruit ____________________________ Dairy ____________________________ Meat ____________________________ Fat ______________________________ Document Released: 06/19/2005 Document Revised: 12/15/2011 Document Reviewed: 04/30/2009 [employer] Patient Information 2014 [employer].

## 2013-06-21 NOTE — Progress Notes (Signed)
[facility]:  Office Visit  Chief Complaint:  Chief Complaint  Patient presents with  . Follow-up    dm     HPI: Aaron Ferguson is a 52 y.o. male who complains of :  1. Diabetes recheck-he has had diabetic eye exam in [place], in April 2014, no diabetes related eye problems.  Denies hypoglycemia, no piolydipisia, no polyuria. Denies neuoropathy. Taking meds. No SEs from meds. He does not check his feet every day.  He is not UTD on flu vaccine but will get his flu vaccine  At work  2. HTN-for renal protection. Doe snot check, no SEs.  3. XOL-taking meds, denies SE.  He is on the following meds:   fish oil-omega-3 fatty acids 1000 MG capsule  Sig: Take 1 g by mouth daily.  Marland Kitchen  lisinopril (PRINIVIL,ZESTRIL) 5 MG tablet  Sig: Take 1 tablet (5 mg total) by mouth daily.  Dispense: 90 tablet  Refill: 3  .  atorvastatin (LIPITOR) 20 MG tablet  Sig: Take 1 tablet (20 mg total) by mouth daily.  Dispense: 90 tablet  Refill: 3  .  metFORMIN (GLUCOPHAGE XR) 500 MG 24 hr tablet  Sig: Take 1 tablet (500 mg total) by mouth daily with breakfast.  Dispense: 90 tablet  Refill: 3    Past Medical History  Diagnosis Date  . GERD (gastroesophageal reflux disease)   . Diabetes mellitus without complication   . Hyperlipidemia    History reviewed. No pertinent past surgical history. History   Social History  . Marital Status: Married    Spouse Name: N/A    Number of Children: N/A  . Years of Education: N/A   Occupational History  . Transportation    Social History Main Topics  . Smoking status: Never Smoker   . Smokeless tobacco: None  . Alcohol Use: No  . Drug Use: No  . Sexual Activity: None   Other Topics Concern  . None   Social History Narrative  . None   History reviewed. No pertinent family history. No Known Allergies Prior to Admission medications   Medication Sig Start Date End Date Taking? Authorizing Provider  atorvastatin (LIPITOR)  20 MG tablet Take 1 tablet (20 mg total) by mouth daily. 01/09/13  Yes Tonye Pearson, MD  fish oil-omega-3 fatty acids 1000 MG capsule Take 1 g by mouth daily.   Yes Historical Provider, MD  lisinopril (PRINIVIL,ZESTRIL) 5 MG tablet Take 1 tablet (5 mg total) by mouth daily. 01/09/13  Yes Tonye Pearson, MD  metFORMIN (GLUCOPHAGE XR) 500 MG 24 hr tablet Take 1 tablet (500 mg total) by mouth daily with breakfast. 01/09/13  Yes Tonye Pearson, MD     ROS: The patient denies fevers, chills, night sweats, unintentional weight loss, chest pain, palpitations, wheezing, dyspnea on exertion, nausea, vomiting, abdominal pain, dysuria, hematuria, melena, numbness, weakness, or tingling.   All other systems have been reviewed and were otherwise negative with the exception of those mentioned in the HPI and as above.    PHYSICAL EXAM: Filed Vitals:   06/21/13 1038  BP: 122/74  Pulse: 74  Temp: 97.9 F (36.6 C)  Resp: 18   Filed Vitals:   06/21/13 1038  Height: 5' 10.5" (1.791 m)  Weight: 232 lb (105.235 kg)   Body mass index is 32.81 kg/(m^2).  General: Alert, no acute distress HEENT:  Normocephalic, atraumatic, oropharynx patent. EOMI, PERRLA Cardiovascular:  Regular rate and rhythm, no rubs murmurs or gallops.  No Carotid bruits, radial pulse intact. No pedal edema.  Respiratory: Clear to auscultation bilaterally.  No wheezes, rales, or rhonchi.  No cyanosis, no use of accessory musculature GI: No organomegaly, abdomen is soft and non-tender, positive bowel sounds.  No masses. Skin: No rashes. + onychomycosis Neurologic: Facial musculature symmetric. Microfilament exam nl.  Psychiatric: Patient is appropriate throughout our interaction. Lymphatic: No cervical lymphadenopathy Musculoskeletal: Gait intact.   LABS: Results for orders placed in visit on 06/21/13  POCT GLYCOSYLATED HEMOGLOBIN (HGB A1C)      Result Value Range   Hemoglobin A1C 6.3       EKG/XRAY:   Primary read  interpreted by Dr. Conley Rolls at [facility].   ASSESSMENT/PLAN: Encounter Diagnoses  Name Primary?  . Diabetes Yes  . Hyperlipidemia    He is doing so much better, last HbA1c was 14.0 five months ago.  C/w metformin and also exercise and weightloss  He still has refills so will do 6 month refills when he calls or on next visit F/u in 3 months  Pending labs: CMP, lipids Recommend : ADA diet, BP goal <140/90, daily foot exams, tobacco cessation if smoking, annual eye exam, annual flu vaccine, PNA vaccine if age and time appropriate.  Gross sideeffects, risk and benefits, and alternatives of medications d/w patient. Patient is aware that all medications have potential sideeffects and we are unable to predict every sideeffect or drug-drug interaction that may occur.  Tanisha Lutes PHUONG, DO 06/21/2013 12:12 PM

## 2013-06-23 ENCOUNTER — Encounter: Payer: Self-pay | Admitting: Family Medicine

## 2013-08-17 ENCOUNTER — Ambulatory Visit: Payer: BC Managed Care – PPO

## 2013-08-17 ENCOUNTER — Ambulatory Visit (INDEPENDENT_AMBULATORY_CARE_PROVIDER_SITE_OTHER): Payer: BC Managed Care – PPO | Admitting: Family Medicine

## 2013-08-17 VITALS — BP 140/78 | HR 74 | Temp 98.0°F | Resp 16 | Ht 70.0 in | Wt 230.0 lb

## 2013-08-17 DIAGNOSIS — M542 Cervicalgia: Secondary | ICD-10-CM

## 2013-08-17 MED ORDER — DICLOFENAC SODIUM 75 MG PO TBEC: 75.0000 mg | DELAYED_RELEASE_TABLET | Freq: Two times a day (BID) | ORAL | Status: DC
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
## 2013-09-18 ENCOUNTER — Ambulatory Visit (INDEPENDENT_AMBULATORY_CARE_PROVIDER_SITE_OTHER): Payer: BC Managed Care – PPO | Admitting: Internal Medicine

## 2013-09-18 VITALS — BP 132/82 | HR 88 | Temp 98.2°F | Resp 17 | Ht 70.5 in | Wt 234.0 lb

## 2013-09-18 DIAGNOSIS — Z6831 Body mass index (BMI) 31.0-31.9, adult: Secondary | ICD-10-CM

## 2013-09-18 DIAGNOSIS — E1059 Type 1 diabetes mellitus with other circulatory complications: Secondary | ICD-10-CM

## 2013-09-18 DIAGNOSIS — E785 Hyperlipidemia, unspecified: Secondary | ICD-10-CM

## 2013-09-18 DIAGNOSIS — E119 Type 2 diabetes mellitus without complications: Secondary | ICD-10-CM

## 2013-09-18 DIAGNOSIS — K219 Gastro-esophageal reflux disease without esophagitis: Secondary | ICD-10-CM
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
## 2013-11-02 DIAGNOSIS — Z0271 Encounter for disability determination: Secondary | ICD-10-CM

## 2013-12-23 ENCOUNTER — Telehealth: Payer: Self-pay | Admitting: Nurse Practitioner

## 2013-12-24 NOTE — Telephone Encounter (Signed)
Error, please disregard.

## 2014-01-03 ENCOUNTER — Other Ambulatory Visit: Payer: Self-pay | Admitting: Internal Medicine

## 2014-04-06 ENCOUNTER — Other Ambulatory Visit: Payer: Self-pay | Admitting: Internal Medicine

## 2014-04-08 ENCOUNTER — Ambulatory Visit (INDEPENDENT_AMBULATORY_CARE_PROVIDER_SITE_OTHER): Payer: BC Managed Care – PPO | Admitting: Internal Medicine

## 2014-04-08 VITALS — BP 130/88 | HR 80 | Temp 98.0°F | Resp 18 | Ht 70.0 in | Wt 238.2 lb

## 2014-04-08 DIAGNOSIS — Z0000 Encounter for general adult medical examination without abnormal findings: Secondary | ICD-10-CM

## 2014-04-08 LAB — GLUCOSE, POCT (MANUAL RESULT ENTRY): POC GLUCOSE: 126 mg/dL — AB (ref 70–99)

## 2014-04-08 LAB — CBC WITH DIFFERENTIAL/PLATELET
BASOS ABS: 0.1 10*3/uL (ref 0.0–0.1)
Basophils Relative: 1 % (ref 0–1)
EOS ABS: 0.3 10*3/uL (ref 0.0–0.7)
Eosinophils Relative: 6 % — ABNORMAL HIGH (ref 0–5)
HCT: 39.9 % (ref 39.0–52.0)
Hemoglobin: 14.1 g/dL (ref 13.0–17.0)
Lymphocytes Relative: 40 % (ref 12–46)
Lymphs Abs: 2.1 10*3/uL (ref 0.7–4.0)
MCH: 27.9 pg (ref 26.0–34.0)
MCHC: 35.3 g/dL (ref 30.0–36.0)
MCV: 79 fL (ref 78.0–100.0)
Monocytes Absolute: 0.3 10*3/uL (ref 0.1–1.0)
Monocytes Relative: 6 % (ref 3–12)
NEUTROS PCT: 47 % (ref 43–77)
Neutro Abs: 2.5 10*3/uL (ref 1.7–7.7)
PLATELETS: 214 10*3/uL (ref 150–400)
RBC: 5.05 MIL/uL (ref 4.22–5.81)
RDW: 13.4 % (ref 11.5–15.5)
WBC: 5.3 10*3/uL (ref 4.0–10.5)

## 2014-04-08 LAB — COMPLETE METABOLIC PANEL WITH GFR
ALT: 33 U/L (ref 0–53)
AST: 30 U/L (ref 0–37)
Albumin: 4.3 g/dL (ref 3.5–5.2)
Alkaline Phosphatase: 41 U/L (ref 39–117)
BUN: 7 mg/dL (ref 6–23)
CO2: 25 mEq/L (ref 19–32)
Calcium: 9.3 mg/dL (ref 8.4–10.5)
Chloride: 103 mEq/L (ref 96–112)
Creat: 1.13 mg/dL (ref 0.50–1.35)
GFR, Est African American: 85 mL/min
GFR, Est Non African American: 73 mL/min
Glucose, Bld: 116 mg/dL — ABNORMAL HIGH (ref 70–99)
Potassium: 4.4 mEq/L (ref 3.5–5.3)
SODIUM: 135 meq/L (ref 135–145)
TOTAL PROTEIN: 6.9 g/dL (ref 6.0–8.3)
Total Bilirubin: 0.7 mg/dL (ref 0.2–1.2)

## 2014-04-08 LAB — LIPID PANEL
CHOL/HDL RATIO: 3.2 ratio
Cholesterol: 124 mg/dL (ref 0–200)
HDL: 39 mg/dL — ABNORMAL LOW (ref >39)
LDL Cholesterol: 76 mg/dL (ref 0–99)
Triglycerides: 44 mg/dL (ref <150)
VLDL: 9 mg/dL (ref 0–40)

## 2014-04-08 LAB — POCT GLYCOSYLATED HEMOGLOBIN (HGB A1C): Hemoglobin A1C: 6.4

## 2014-04-08 MED ORDER — LISINOPRIL 5 MG PO TABS: ORAL_TABLET | ORAL | Status: DC

## 2014-04-08 MED ORDER — METFORMIN HCL ER 500 MG PO TB24: ORAL_TABLET | ORAL | Status: DC

## 2014-04-08 MED ORDER — ATORVASTATIN CALCIUM 20 MG PO TABS: ORAL_TABLET | ORAL | Status: DC

## 2014-04-08 NOTE — Progress Notes (Signed)
Subjective:  This chart was scribed for Aaron Lin, MD by Mercy Moore, Medial Scribe. This patient was seen in room 4 and the patient's care was started at 9:52 AM.    Patient ID: Aaron Ferguson, male    DOB: December 12, 1960, 53 y.o.   MRN: [id number]  HPI HPI Comments: Aaron Ferguson is a 53 y.o. male who presents to the [facility] requesting complete physical exam and DM evaluation. Patient denies checking his A1C levels regularly at home. He denies complications with any of his medications: Lipitor, Voltaren, Lisinopril, and Metformin.  Patient states that his energy levels are high; he denies fatigue, appetite change or sleep disturbance.  Patient states he is up to date on his Tetanus vaccination.   Patient Active Problem List   Diagnosis Date Noted  . DM (diabetes mellitus) 01/10/2013  . Other and unspecified hyperlipidemia 01/09/2013  . BMI 31.0-31.9,adult 01/09/2013  . GERD (gastroesophageal reflux disease) 01/09/2013   Past Medical History  Diagnosis Date  . GERD (gastroesophageal reflux disease)   . Diabetes mellitus without complication   . Hyperlipidemia    Past Surgical History  Procedure Laterality Date  . Eye surgery     No Known Allergies Prior to Admission medications   Medication Sig Start Date End Date Taking? Authorizing Provider  atorvastatin (LIPITOR) 20 MG tablet TAKE 1 TABLET (20 MG TOTAL) BY MOUTH DAILY. 04/06/14  Yes Eleanore Kurtis Bushman, PA-C  diclofenac (VOLTAREN) 75 MG EC tablet Take 1 tablet (75 mg total) by mouth 2 (two) times daily. 08/17/13  Yes Robyn Haber, MD  fish oil-omega-3 fatty acids 1000 MG capsule Take 1 g by mouth daily.   Yes Historical Provider, MD  lisinopril (PRINIVIL,ZESTRIL) 5 MG tablet TAKE 1 TABLET (5 MG TOTAL) BY MOUTH DAILY. 04/06/14  Yes Theda Sers, PA-C  metFORMIN (GLUCOPHAGE-XR) 500 MG 24 hr tablet TAKE 1 TABLET (500 MG TOTAL) BY MOUTH DAILY WITH BREAKFAST. 04/06/14  Yes Theda Sers, PA-C    History   Social History  . Marital Status: Married    Spouse Name: N/A    Number of Children: N/A  . Years of Education: N/A   Occupational History  . Transportation    Social History Main Topics  . Smoking status: Never Smoker   . Smokeless tobacco: Not on file  . Alcohol Use: No  . Drug Use: No  . Sexual Activity: Not on file   Other Topics Concern  . Not on file   Social History Narrative  . No narrative on file    imm -utd   Review of Systems  Constitutional: Negative for appetite change, fatigue and unexpected weight change.  Musculoskeletal: Negative for back pain.  remainder 14pt ros form all negative     Objective:   Physical Exam  Nursing note and vitals reviewed. Constitutional: He is oriented to person, place, and time. He appears well-developed and well-nourished. No distress.  HENT:  Head: Normocephalic and atraumatic.  Right Ear: External ear normal.  Left Ear: External ear normal.  Nose: Nose normal.  Mouth/Throat: Oropharynx is clear and moist.  Tms and canals clear  Eyes: Conjunctivae and EOM are normal. Pupils are equal, round, and reactive to light.  Neck: Normal range of motion. Neck supple. No thyromegaly present.  Cardiovascular: Normal rate, regular rhythm, normal heart sounds and intact distal pulses.   No murmur heard. Pulmonary/Chest: Effort normal and breath sounds normal. No respiratory distress. He has no wheezes. He  has no rales.  Abdominal: Soft. Bowel sounds are normal. He exhibits no distension and no mass. There is no tenderness. There is no rebound and no guarding.  No hepatosplenomegaly  Musculoskeletal: Normal range of motion. He exhibits no edema and no tenderness.  Lymphadenopathy:    He has no cervical adenopathy.  Neurological: He is alert and oriented to person, place, and time. He has normal reflexes. No cranial nerve deficit. He exhibits normal muscle tone. Coordination normal.  Skin: Skin is warm and dry. No  rash noted.  Fungal changes on right great toe and all left toes.  Psychiatric: He has a normal mood and affect. His behavior is normal. Judgment and thought content normal.    Filed Vitals:   04/08/14 0847  BP: 130/88  Pulse: 80  Temp: 98 F (36.7 C)  Resp: 18   Results for orders placed in visit on 04/08/14  CBC WITH DIFFERENTIAL      Result Value Ref Range   WBC 5.3  4.0 - 10.5 K/uL   RBC 5.05  4.22 - 5.81 MIL/uL   Hemoglobin 14.1  13.0 - 17.0 g/dL   HCT 39.9  39.0 - 52.0 %   MCV 79.0  78.0 - 100.0 fL   MCH 27.9  26.0 - 34.0 pg   MCHC 35.3  30.0 - 36.0 g/dL   RDW 13.4  11.5 - 15.5 %   Platelets 214  150 - 400 K/uL   Neutrophils Relative % 47  43 - 77 %   Neutro Abs 2.5  1.7 - 7.7 K/uL   Lymphocytes Relative 40  12 - 46 %   Lymphs Abs 2.1  0.7 - 4.0 K/uL   Monocytes Relative 6  3 - 12 %   Monocytes Absolute 0.3  0.1 - 1.0 K/uL   Eosinophils Relative 6 (*) 0 - 5 %   Eosinophils Absolute 0.3  0.0 - 0.7 K/uL   Basophils Relative 1  0 - 1 %   Basophils Absolute 0.1  0.0 - 0.1 K/uL   Smear Review Criteria for review not met    COMPLETE METABOLIC PANEL WITH GFR      Result Value Ref Range   Sodium 135  135 - 145 mEq/L   Potassium 4.4  3.5 - 5.3 mEq/L   Chloride 103  96 - 112 mEq/L   CO2 25  19 - 32 mEq/L   Glucose, Bld 116 (*) 70 - 99 mg/dL   BUN 7  6 - 23 mg/dL   Creat 1.13  0.50 - 1.35 mg/dL   Total Bilirubin 0.7  0.2 - 1.2 mg/dL   Alkaline Phosphatase 41  39 - 117 U/L   AST 30  0 - 37 U/L   ALT 33  0 - 53 U/L   Total Protein 6.9  6.0 - 8.3 g/dL   Albumin 4.3  3.5 - 5.2 g/dL   Calcium 9.3  8.4 - 10.5 mg/dL   GFR, Est African American 85     GFR, Est Non African American 73    LIPID PANEL      Result Value Ref Range   Cholesterol 124  0 - 200 mg/dL   Triglycerides 44  <150 mg/dL   HDL 39 (*) >39 mg/dL   Total CHOL/HDL Ratio 3.2     VLDL 9  0 - 40 mg/dL   LDL Cholesterol 76  0 - 99 mg/dL  PSA      Result Value Ref Range  PSA    <=4.00 ng/mL  GLUCOSE, POCT  (MANUAL RESULT ENTRY)      Result Value Ref Range   POC Glucose 126 (*) 70 - 99 mg/dl  POCT GLYCOSYLATED HEMOGLOBIN (HGB A1C)      Result Value Ref Range   Hemoglobin A1C 6.4         Assessment & Plan:  Routine general medical examination at a health care facility -  AODM-well controlled Lipids in good zone

## 2014-04-09 ENCOUNTER — Encounter: Payer: Self-pay | Admitting: Internal Medicine

## 2014-04-10 LAB — PSA: PSA: 0.48 ng/mL (ref <=4.00)

## 2014-04-11 ENCOUNTER — Encounter: Payer: Self-pay | Admitting: Internal Medicine
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
## 2015-02-10 ENCOUNTER — Encounter (HOSPITAL_COMMUNITY): Payer: Self-pay | Admitting: *Deleted

## 2015-02-10 ENCOUNTER — Emergency Department (HOSPITAL_COMMUNITY)
Admission: EM | Admit: 2015-02-10 | Discharge: 2015-02-10 | Disposition: A | Discharge status: Home / Self Care | Payer: BLUE CROSS/BLUE SHIELD | Attending: Emergency Medicine | Admitting: Emergency Medicine

## 2015-02-10 DIAGNOSIS — Z791 Long term (current) use of non-steroidal anti-inflammatories (NSAID): Secondary | ICD-10-CM | POA: Diagnosis not present

## 2015-02-10 DIAGNOSIS — S76111A Strain of right quadriceps muscle, fascia and tendon, initial encounter: Secondary | ICD-10-CM

## 2015-02-10 DIAGNOSIS — Y998 Other external cause status: Secondary | ICD-10-CM | POA: Diagnosis not present

## 2015-02-10 DIAGNOSIS — E119 Type 2 diabetes mellitus without complications: Secondary | ICD-10-CM | POA: Diagnosis not present

## 2015-02-10 DIAGNOSIS — S79921A Unspecified injury of right thigh, initial encounter: Secondary | ICD-10-CM | POA: Diagnosis present

## 2015-02-10 DIAGNOSIS — Y9289 Other specified places as the place of occurrence of the external cause: Secondary | ICD-10-CM | POA: Diagnosis not present

## 2015-02-10 DIAGNOSIS — Z79899 Other long term (current) drug therapy: Secondary | ICD-10-CM | POA: Insufficient documentation

## 2015-02-10 DIAGNOSIS — Y9389 Activity, other specified: Secondary | ICD-10-CM | POA: Diagnosis not present

## 2015-02-10 DIAGNOSIS — X58XXXA Exposure to other specified factors, initial encounter: Secondary | ICD-10-CM | POA: Diagnosis not present

## 2015-02-10 DIAGNOSIS — Z8719 Personal history of other diseases of the digestive system: Secondary | ICD-10-CM | POA: Diagnosis not present

## 2015-02-10 DIAGNOSIS — E785 Hyperlipidemia, unspecified: Secondary | ICD-10-CM | POA: Diagnosis not present

## 2015-02-10 NOTE — Discharge Instructions (Signed)

## 2015-02-10 NOTE — ED Provider Notes (Signed)
CSN: [id number]     Arrival date & time 02/10/15  2228 History  This chart was scribed for Azalia BilisKevin Emily Forse, MD by Tanda RockersMargaux Venter, ED Scribe. This patient was seen in room APA05/APA05 and the patient's care was started at 11:28 PM.    Chief Complaint  Patient presents with  . Leg Pain   The history is provided by the patient. No language interpreter was used.    HPI Comments: Aaron Ferguson is a 54 y.o. male who presents to the Emergency Department complaining of sudden onset right thigh pain that began earlier today. Pt was fishing and dropped his phone in the water. Pt tried to get phone out of the water and slipped. He tried to catch himself by placing extra weight onto his right leg, which caused the pain. The pain is exacerbated with squatting and twisting the leg. Pt took 400 mg Ibuprofen with mild relief. He is able to ambulate. Denies weakness or numbness in the extremities, or any other symptoms.    Past Medical History  Diagnosis Date  . GERD (gastroesophageal reflux disease)   . Diabetes mellitus without complication   . Hyperlipidemia    Past Surgical History  Procedure Laterality Date  . Eye surgery     History reviewed. No pertinent family history. History  Substance Use Topics  . Smoking status: Never Smoker   . Smokeless tobacco: Not on file  . Alcohol Use: No    Review of Systems  A complete 10 system review of systems was obtained and all systems are negative except as noted in the HPI and PMH.    Allergies  Review of patient's allergies indicates no known allergies.  Home Medications   Prior to Admission medications   Medication Sig Start Date End Date Taking? Authorizing Provider  atorvastatin (LIPITOR) 20 MG tablet TAKE 1 TABLET (20 MG TOTAL) BY MOUTH DAILY. 04/08/14  Yes Tonye Pearsonobert P Doolittle, MD  diclofenac (VOLTAREN) 75 MG EC tablet Take 1 tablet (75 mg total) by mouth 2 (two) times daily. 08/17/13  Yes Elvina SidleKurt Lauenstein, MD  fish oil-omega-3 fatty acids 1000  MG capsule Take 1 g by mouth daily.   Yes Historical Provider, MD  ibuprofen (ADVIL,MOTRIN) 200 MG tablet Take 400 mg by mouth every 6 (six) hours as needed for mild pain.   Yes Historical Provider, MD  lisinopril (PRINIVIL,ZESTRIL) 5 MG tablet TAKE 1 TABLET (5 MG TOTAL) BY MOUTH DAILY. 04/08/14  Yes Tonye Pearsonobert P Doolittle, MD  metFORMIN (GLUCOPHAGE-XR) 500 MG 24 hr tablet TAKE 1 TABLET (500 MG TOTAL) BY MOUTH DAILY WITH BREAKFAST. 04/08/14  Yes Tonye Pearsonobert P Doolittle, MD  tetrahydrozoline 0.05 % ophthalmic solution Place 1 drop into both eyes daily.   Yes Historical Provider, MD   Triage Vitals: BP 134/74 mmHg  Pulse 79  Temp(Src) 98 F (36.7 C) (Oral)  Resp 18  Ht 5' 11.5" (1.816 m)  Wt 240 lb (108.863 kg)  BMI 33.01 kg/m2  SpO2 100%   Physical Exam  Constitutional: He is oriented to person, place, and time. He appears well-developed and well-nourished.  HENT:  Head: Normocephalic and atraumatic.  Eyes: EOM are normal.  Neck: Normal range of motion.  Cardiovascular: Normal rate, regular rhythm, normal heart sounds and intact distal pulses.   Pulmonary/Chest: Effort normal and breath sounds normal. No respiratory distress.  Abdominal: Soft. He exhibits no distension. There is no tenderness.  Musculoskeletal: Normal range of motion.  Full ROM of right knee.  Normal extension of the right  knee.   Neurological: He is alert and oriented to person, place, and time.  Skin: Skin is warm and dry.  Psychiatric: He has a normal mood and affect. Judgment normal.  Nursing note and vitals reviewed.   ED Course  Procedures (including critical care time)  DIAGNOSTIC STUDIES: Oxygen Saturation is 100% on RA, normal by my interpretation.    COORDINATION OF CARE: 11:31 PM-Discussed treatment plan which includes applying ice and heat on leg as well as taking Ibuprofen for pain with pt at bedside and pt agreed to plan.   Labs Review Labs Reviewed - No data to display  Imaging Review No results  found.   EKG Interpretation None      MDM   Final diagnoses:  Quadriceps muscle strain, right, initial encounter    Suspect right quad strain. Patella and quad tendon intact. Dc home with motrin and recommendations for ice.  pcp follow up  I personally performed the services described in this documentation, which was scribed in my presence. The recorded information has been reviewed and is accurate.      Azalia BilisKevin Imara Standiford, MD 02/11/15 [phone]

## 2015-02-10 NOTE — ED Notes (Signed)
Pt reporting pain in right leg after he twisted his knee while fishing.  Pt reporting pain primarily in right knee and thigh.

## 2015-05-01 ENCOUNTER — Other Ambulatory Visit: Payer: Self-pay | Admitting: Internal Medicine

## 2015-05-09 ENCOUNTER — Encounter: Payer: Self-pay | Admitting: *Deleted

## 2015-05-22 ENCOUNTER — Ambulatory Visit (INDEPENDENT_AMBULATORY_CARE_PROVIDER_SITE_OTHER): Payer: BLUE CROSS/BLUE SHIELD | Admitting: Internal Medicine

## 2015-05-22 ENCOUNTER — Other Ambulatory Visit: Payer: Self-pay | Admitting: Internal Medicine

## 2015-05-22 VITALS — BP 132/82 | HR 76 | Temp 97.8°F | Resp 18 | Ht 70.0 in | Wt 237.0 lb

## 2015-05-22 DIAGNOSIS — Z23 Encounter for immunization: Secondary | ICD-10-CM | POA: Diagnosis not present

## 2015-05-22 DIAGNOSIS — Z0000 Encounter for general adult medical examination without abnormal findings: Secondary | ICD-10-CM | POA: Diagnosis not present

## 2015-05-22 DIAGNOSIS — Z6834 Body mass index (BMI) 34.0-34.9, adult: Secondary | ICD-10-CM | POA: Diagnosis not present

## 2015-05-22 DIAGNOSIS — E119 Type 2 diabetes mellitus without complications: Secondary | ICD-10-CM

## 2015-05-22 LAB — POCT GLYCOSYLATED HEMOGLOBIN (HGB A1C): HEMOGLOBIN A1C: 6.6

## 2015-05-22 LAB — IFOBT (OCCULT BLOOD): IMMUNOLOGICAL FECAL OCCULT BLOOD TEST: NEGATIVE

## 2015-05-22 MED ORDER — METFORMIN HCL ER 500 MG PO TB24: ORAL_TABLET | ORAL | Status: DC

## 2015-05-22 MED ORDER — ATORVASTATIN CALCIUM 20 MG PO TABS: 20.0000 mg | ORAL_TABLET | Freq: Every day | ORAL | Status: DC

## 2015-05-22 MED ORDER — LISINOPRIL 5 MG PO TABS: ORAL_TABLET | ORAL | Status: DC

## 2015-05-22 NOTE — Progress Notes (Addendum)
Subjective:  This chart was scribed for Tami Lin, MD by [facility], medical scribe at [facility].The patient was seen in exam room 02 and the patient's care was started at 3:08 PM.   Patient ID: Aaron Ferguson, male    DOB: June 08, 1961, 54 y.o.   MRN: [id number] Chief Complaint  Patient presents with  . Annual Exam   HPI HPI Comments: Aaron Ferguson is a 54 y.o. male with a history of diabetes and hyperlipidemia who presents to [facility] here for an annual physical exam. Doing well today. [facility], had a recent check up. Surgery to repair his retina in the left eye one year ago. No trouble hearing. Pmh of diabetes his taking metformin 500 mg daily. He denies increased urinary frequency.  Last colonoscopy was in 2011.  immuniz ? 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 Married to margaret=great  See past  Past Medical History  Diagnosis Date  . GERD (gastroesophageal reflux disease)   . Diabetes mellitus without complication   . Hyperlipidemia    Prior to Admission medications   Medication Sig Start Date End Date Taking? Authorizing Provider  atorvastatin (LIPITOR) 20 MG tablet TAKE 1 TABLET (20 MG TOTAL) BY MOUTH DAILY. 05/01/15  Yes Mancel Bale, PA-C  fish oil-omega-3 fatty acids 1000 MG capsule Take 1 g by mouth daily.   Yes Historical Provider, MD  lisinopril (PRINIVIL,ZESTRIL) 5 MG tablet TAKE 1 TABLET (5 MG TOTAL) BY MOUTH DAILY. 05/01/15  Yes Sarah Alleen Borne, PA-C  metFORMIN (GLUCOPHAGE-XR) 500 MG 24 hr tablet TAKE 1 TABLET (500 MG TOTAL) BY MOUTH DAILY WITH BREAKFAST. 05/01/15  Yes Mancel Bale, PA-C  tetrahydrozoline 0.05 % ophthalmic solution Place 1 drop into both eyes daily.   Yes Historical Provider, MD  diclofenac (VOLTAREN) 75 MG EC tablet Take 1 tablet (75 mg total) by mouth 2 (two) times daily. Patient not taking: Reported on 05/22/2015 08/17/13   Robyn Haber, MD  ibuprofen (ADVIL,MOTRIN) 200 MG tablet Take 400 mg by mouth every 6 (six) hours as needed for mild pain.    Historical Provider, MD   No Known Allergies  Review of Systems  Genitourinary: Negative for urgency and frequency.  rest of 14pt ROS neg per form     Objective:  BP 132/82 mmHg  Pulse 76  Temp(Src) 97.8 F (36.6 C) (Oral)  Resp 18  Ht 5' 10"  (1.778 m)  Wt 237 lb (107.502 kg)  BMI 34.01 kg/m2  SpO2 96% Physical Exam  Constitutional: He is oriented to person, place, and time. He appears well-developed and well-nourished. No distress.  HENT:  Head: Normocephalic and atraumatic.  Right Ear: Hearing, tympanic membrane, external ear and ear canal normal.  Left Ear: Hearing, tympanic membrane, external ear and ear canal normal.  Nose: Nose normal.  Mouth/Throat: Uvula is midline, oropharynx is clear and moist and mucous membranes are normal.  Eyes: Conjunctivae, EOM and lids are normal. Pupils are equal, round, and reactive to light. Right eye exhibits no discharge. Left eye exhibits no discharge. No scleral icterus.  Neck: Trachea normal and normal range of motion. Neck supple. Carotid bruit is not present.  Cardiovascular: Normal rate, regular rhythm, normal heart sounds, intact distal pulses and normal pulses.   No murmur heard. Pulmonary/Chest: Effort normal and breath sounds normal. No respiratory distress. He has no wheezes. He has no rhonchi. He has  no rales.  Abdominal: Soft. Normal appearance and bowel sounds are normal. He exhibits no abdominal bruit. There is no tenderness.  Genitourinary:  Pros soft symm nontend no nod Hemosure neg  Musculoskeletal: Normal range of motion. He exhibits no edema or tenderness.  Lymphadenopathy:       Head (right side): No submental, no submandibular, no tonsillar, no preauricular, no posterior auricular and no occipital adenopathy present.       Head (left side): No submental, no submandibular, no tonsillar, no  preauricular, no posterior auricular and no occipital adenopathy present.    He has no cervical adenopathy.  Neurological: He is alert and oriented to person, place, and time. He has normal strength and normal reflexes. No cranial nerve deficit or sensory deficit. Coordination and gait normal.  Skin: Skin is warm, dry and intact. No lesion and no rash noted.  Psychiatric: He has a normal mood and affect. His speech is normal and behavior is normal. Judgment and thought content normal.  Nursing note and vitals reviewed. A1C great!!!    Assessment & Plan:  Type 2 diabetes mellitus without complication - Plan: Comprehensive metabolic panel, POCT glycosylated hemoglobin (Hb A1C), Microalbumin, urine  Annual physical exam - Plan: CBC with Differential/Platelet, Hepatitis C antibody, HIV antibody, PSA, IFOBT POC (occult bld, rslt in office)  BMI 34--up from 31 a few yrs ago Meds ordered this encounter  Medications  . metFORMIN (GLUCOPHAGE-XR) 500 MG 24 hr tablet    Sig: TAKE 1 TABLET (500 MG TOTAL) BY MOUTH DAILY WITH BREAKFAST.    Dispense:  90 tablet    Refill:  3  . lisinopril (PRINIVIL,ZESTRIL) 5 MG tablet    Sig: TAKE 1 TABLET (5 MG TOTAL) BY MOUTH DAILY.    Dispense:  90 tablet    Refill:  3  . atorvastatin (LIPITOR) 20 MG tablet    Sig: Take 1 tablet (20 mg total) by mouth daily at 6 PM.    Dispense:  90 tablet    Refill:  3  stressed the need for weight loss  adden-- Labs Results for orders placed or performed in visit on 05/22/15  CBC with Differential/Platelet  Result Value Ref Range   WBC 5.9 4.0 - 10.5 K/uL   RBC 4.84 4.22 - 5.81 MIL/uL   Hemoglobin 13.4 13.0 - 17.0 g/dL   HCT 39.5 39.0 - 52.0 %   MCV 81.6 78.0 - 100.0 fL   MCH 27.7 26.0 - 34.0 pg   MCHC 33.9 30.0 - 36.0 g/dL   RDW 14.0 11.5 - 15.5 %   Platelets 206 150 - 400 K/uL   MPV 9.9 8.6 - 12.4 fL   Neutrophils Relative % 46 43 - 77 %   Neutro Abs 2.7 1.7 - 7.7 K/uL   Lymphocytes Relative 42 12 - 46 %    Lymphs Abs 2.5 0.7 - 4.0 K/uL   Monocytes Relative 6 3 - 12 %   Monocytes Absolute 0.4 0.1 - 1.0 K/uL   Eosinophils Relative 5 0 - 5 %   Eosinophils Absolute 0.3 0.0 - 0.7 K/uL   Basophils Relative 1 0 - 1 %   Basophils Absolute 0.1 0.0 - 0.1 K/uL   Smear Review Criteria for review not met   Comprehensive metabolic panel  Result Value Ref Range   Sodium 140 135 - 146 mmol/L   Potassium 4.4 3.5 - 5.3 mmol/L   Chloride 103 98 - 110 mmol/L   CO2 25 20 - 31 mmol/L  Glucose, Bld 88 65 - 99 mg/dL   BUN 10 7 - 25 mg/dL   Creat 0.97 0.70 - 1.33 mg/dL   Total Bilirubin 0.9 0.2 - 1.2 mg/dL   Alkaline Phosphatase 41 40 - 115 U/L   AST 31 10 - 35 U/L   ALT 40 9 - 46 U/L   Total Protein 6.8 6.1 - 8.1 g/dL   Albumin 4.3 3.6 - 5.1 g/dL   Calcium 9.3 8.6 - 10.3 mg/dL  Hepatitis C antibody  Result Value Ref Range   HCV Ab NEGATIVE NEGATIVE  HIV antibody  Result Value Ref Range   HIV 1&2 Ab, 4th Generation NONREACTIVE NONREACTIVE  PSA  Result Value Ref Range   PSA 0.55 <=4.00 ng/mL  Microalbumin, urine  Result Value Ref Range   Microalb, Ur 1.3 <2.0 mg/dL  POCT glycosylated hemoglobin (Hb A1C)  Result Value Ref Range   Hemoglobin A1C 6.6   IFOBT POC (occult bld, rslt in office)  Result Value Ref Range   IFOBT Negative     Contin opth f/u F/u 1 yr Meds ordered this encounter  Medications  . metFORMIN (GLUCOPHAGE-XR) 500 MG 24 hr tablet    Sig: TAKE 1 TABLET (500 MG TOTAL) BY MOUTH DAILY WITH BREAKFAST.    Dispense:  90 tablet    Refill:  3  . lisinopril (PRINIVIL,ZESTRIL) 5 MG tablet    Sig: TAKE 1 TABLET (5 MG TOTAL) BY MOUTH DAILY.    Dispense:  90 tablet    Refill:  3  . atorvastatin (LIPITOR) 20 MG tablet    Sig: Take 1 tablet (20 mg total) by mouth daily at 6 PM.    Dispense:  90 tablet    Refill:  3    I have completed the patient encounter in its entirety as documented by the scribe, with editing by me where necessary. Anise Harbin P. Laney Pastor, M.D.

## 2015-05-23 LAB — PSA: PSA: 0.55 ng/mL (ref <=4.00)

## 2015-05-23 LAB — CBC WITH DIFFERENTIAL/PLATELET
BASOS PCT: 1 % (ref 0–1)
Basophils Absolute: 0.1 10*3/uL (ref 0.0–0.1)
Eosinophils Absolute: 0.3 10*3/uL (ref 0.0–0.7)
Eosinophils Relative: 5 % (ref 0–5)
HCT: 39.5 % (ref 39.0–52.0)
HEMOGLOBIN: 13.4 g/dL (ref 13.0–17.0)
LYMPHS PCT: 42 % (ref 12–46)
Lymphs Abs: 2.5 10*3/uL (ref 0.7–4.0)
MCH: 27.7 pg (ref 26.0–34.0)
MCHC: 33.9 g/dL (ref 30.0–36.0)
MCV: 81.6 fL (ref 78.0–100.0)
MONO ABS: 0.4 10*3/uL (ref 0.1–1.0)
MONOS PCT: 6 % (ref 3–12)
MPV: 9.9 fL (ref 8.6–12.4)
NEUTROS ABS: 2.7 10*3/uL (ref 1.7–7.7)
Neutrophils Relative %: 46 % (ref 43–77)
Platelets: 206 10*3/uL (ref 150–400)
RBC: 4.84 MIL/uL (ref 4.22–5.81)
RDW: 14 % (ref 11.5–15.5)
WBC: 5.9 10*3/uL (ref 4.0–10.5)

## 2015-05-23 LAB — COMPREHENSIVE METABOLIC PANEL
ALBUMIN: 4.3 g/dL (ref 3.6–5.1)
ALK PHOS: 41 U/L (ref 40–115)
ALT: 40 U/L (ref 9–46)
AST: 31 U/L (ref 10–35)
BILIRUBIN TOTAL: 0.9 mg/dL (ref 0.2–1.2)
BUN: 10 mg/dL (ref 7–25)
CALCIUM: 9.3 mg/dL (ref 8.6–10.3)
CO2: 25 mmol/L (ref 20–31)
CREATININE: 0.97 mg/dL (ref 0.70–1.33)
Chloride: 103 mmol/L (ref 98–110)
Glucose, Bld: 88 mg/dL (ref 65–99)
Potassium: 4.4 mmol/L (ref 3.5–5.3)
Sodium: 140 mmol/L (ref 135–146)
Total Protein: 6.8 g/dL (ref 6.1–8.1)

## 2015-05-23 LAB — HIV ANTIBODY (ROUTINE TESTING W REFLEX): HIV: NONREACTIVE

## 2015-05-23 LAB — HEPATITIS C ANTIBODY: HCV AB: NEGATIVE

## 2015-05-23 LAB — MICROALBUMIN, URINE: Microalb, Ur: 1.3 mg/dL (ref <2.0)

## 2015-05-24 LAB — LIPID PANEL
CHOL/HDL RATIO: 2.9 ratio (ref <=5.0)
CHOLESTEROL: 152 mg/dL (ref 125–200)
HDL: 52 mg/dL (ref >=40)
LDL Cholesterol: 90 mg/dL (ref <130)
Triglycerides: 49 mg/dL (ref <150)
VLDL: 10 mg/dL (ref <30)

## 2015-05-29 ENCOUNTER — Encounter: Payer: Self-pay | Admitting: Internal Medicine

## 2015-09-03 ENCOUNTER — Encounter: Payer: Self-pay | Admitting: Internal Medicine

## 2015-12-01 ENCOUNTER — Ambulatory Visit (INDEPENDENT_AMBULATORY_CARE_PROVIDER_SITE_OTHER): Payer: Commercial Managed Care - HMO | Admitting: Internal Medicine

## 2015-12-01 VITALS — BP 134/80 | HR 77 | Temp 97.9°F | Resp 14 | Ht 71.0 in | Wt 234.0 lb

## 2015-12-01 DIAGNOSIS — Z6834 Body mass index (BMI) 34.0-34.9, adult: Secondary | ICD-10-CM | POA: Diagnosis not present

## 2015-12-01 DIAGNOSIS — J0100 Acute maxillary sinusitis, unspecified: Secondary | ICD-10-CM

## 2015-12-01 DIAGNOSIS — E785 Hyperlipidemia, unspecified: Secondary | ICD-10-CM | POA: Diagnosis not present

## 2015-12-01 DIAGNOSIS — K219 Gastro-esophageal reflux disease without esophagitis: Secondary | ICD-10-CM

## 2015-12-01 DIAGNOSIS — E119 Type 2 diabetes mellitus without complications: Secondary | ICD-10-CM | POA: Diagnosis not present

## 2015-12-01 LAB — POCT GLYCOSYLATED HEMOGLOBIN (HGB A1C): Hemoglobin A1C: 6.8

## 2015-12-01 MED ORDER — AMOXICILLIN 500 MG PO CAPS: 1000.0000 mg | ORAL_CAPSULE | Freq: Two times a day (BID) | ORAL | Status: AC

## 2015-12-01 MED ORDER — FLUTICASONE PROPIONATE 50 MCG/ACT NA SUSP
NASAL | Status: DC
Start: 2015-12-01 — End: 2016-05-22

## 2015-12-01 NOTE — Progress Notes (Signed)
Subjective:  By signing my name below, I, Raven Small, attest that this documentation has been prepared under the direction and in the presence of Ellamae Sia, MD.  Electronically Signed: Andrew Au, ED Scribe. 12/01/2015. 2:27 PM.   Patient ID: Aaron Ferguson, male    DOB: [birth year], 55 y.o.   MRN: [id number]  HPI Chief Complaint  Patient presents with   Diabetes    recheck    Sinusitis    2 week    Cough    2 weeks    HPI Comments: Aaron Ferguson is a 56 y.o. male who presents to the [facility] complaining of sinus congestion that began 2 weeks ago. He reports associated cough. Pt cough some at night but has worsening congestion at night. He has tried OTC medication. Pt has sick contacts at work.   Pt would also like blood work to check DM. He takes metformin and is tolerating medication well.    Past Medical History  Diagnosis Date   GERD (gastroesophageal reflux disease)    Diabetes mellitus without complication (HCC)    Hyperlipidemia    Past Surgical History  Procedure Laterality Date   Eye surgery     Prior to Admission medications   Medication Sig Start Date End Date Taking? Authorizing Provider  atorvastatin (LIPITOR) 20 MG tablet Take 1 tablet (20 mg total) by mouth daily at 6 PM. 05/22/15  Yes Tonye Pearson, MD  diclofenac (VOLTAREN) 75 MG EC tablet Take 1 tablet (75 mg total) by mouth 2 (two) times daily. 08/17/13  Yes Elvina Sidle, MD  fish oil-omega-3 fatty acids 1000 MG capsule Take 1 g by mouth daily.   Yes Historical Provider, MD  lisinopril (PRINIVIL,ZESTRIL) 5 MG tablet TAKE 1 TABLET (5 MG TOTAL) BY MOUTH DAILY. 05/22/15  Yes Tonye Pearson, MD  metFORMIN (GLUCOPHAGE-XR) 500 MG 24 hr tablet TAKE 1 TABLET (500 MG TOTAL) BY MOUTH DAILY WITH BREAKFAST. 05/22/15  Yes Tonye Pearson, MD  tetrahydrozoline 0.05 % ophthalmic solution Place 1 drop into both eyes daily.   Yes Historical Provider, MD  ibuprofen  (ADVIL,MOTRIN) 200 MG tablet Take 400 mg by mouth every 6 (six) hours as needed for mild pain. Reported on 12/01/2015    Historical Provider, MD   Review of Systems  Constitutional: Negative for fever and chills.  HENT: Positive for congestion.   Respiratory: Positive for cough. Negative for shortness of breath.    Objective:   Physical Exam  Constitutional: He is oriented to person, place, and time. He appears well-developed and well-nourished. No distress.  HENT:  Head: Normocephalic and atraumatic.  Right Ear: Hearing, tympanic membrane, external ear and ear canal normal.  Left Ear: Hearing, tympanic membrane and external ear normal.  Nose: Mucosal edema present.  Mouth/Throat: Uvula is midline, oropharynx is clear and moist and mucous membranes are normal.  Eyes: Conjunctivae and EOM are normal.  Neck: Neck supple.  Cardiovascular: Normal rate.   Pulmonary/Chest: Effort normal.  Musculoskeletal: Normal range of motion.  Neurological: He is alert and oriented to person, place, and time.  Skin: Skin is warm and dry.  Psychiatric: He has a normal mood and affect. His behavior is normal.  Nursing note and vitals reviewed.  Filed Vitals:   12/01/15 1323  BP: 134/80  Pulse: 77  Temp: 97.9 F (36.6 C)  TempSrc: Oral  Resp: 14  Height:  (1.803 m)  Weight: 234 lb (106.142 kg)  SpO2: 96%  Results for orders placed or performed in visit on 12/01/15  POCT glycosylated hemoglobin (Hb A1C)  Result Value Ref Range   Hemoglobin A1C 6.8    Assessment & Plan:  Type 2 diabetes mellitus without complication, without long-term current use of insulin (HCC) - Plan: POCT glycosylated hemoglobin (Hb A1C)  BMI 34.0-34.9,adult  Hyperlipidemia  Gastroesophageal reflux disease without esophagitis  Meds ordered this encounter  Medications   amoxicillin (AMOXIL) 500 MG capsule    Sig: Take 2 capsules (1,000 mg total) by mouth 2 (two) times daily.    Dispense:  40 capsule     Refill:  0   fluticasone (FLONASE) 50 MCG/ACT nasal spray    Sig: 1 spray in each nostril twice a day for 2 weeks and then 1 spray in nostril at bedtime for 2 weeks    Dispense:  16 g    Refill:  6   He has had medications to continue. Follow-up 6 months Continue metformin once a day Continue Lipitor Continue lisinopril

## 2015-12-01 NOTE — Patient Instructions (Signed)
Consider f/u with Barnie Del here in the walkin clinic Next labs 6months

## 2016-05-22 ENCOUNTER — Encounter: Payer: Self-pay | Admitting: Family Medicine

## 2016-05-22 ENCOUNTER — Ambulatory Visit (INDEPENDENT_AMBULATORY_CARE_PROVIDER_SITE_OTHER): Payer: Commercial Managed Care - HMO | Admitting: Family Medicine

## 2016-05-22 VITALS — BP 120/74 | HR 97 | Temp 97.8°F | Resp 16 | Ht 71.0 in | Wt 240.0 lb

## 2016-05-22 DIAGNOSIS — E1129 Type 2 diabetes mellitus with other diabetic kidney complication: Secondary | ICD-10-CM | POA: Diagnosis not present

## 2016-05-22 DIAGNOSIS — E785 Hyperlipidemia, unspecified: Secondary | ICD-10-CM | POA: Diagnosis not present

## 2016-05-22 MED ORDER — METFORMIN HCL ER 500 MG PO TB24: ORAL_TABLET | ORAL | 3 refills | Status: DC

## 2016-05-22 MED ORDER — ATORVASTATIN CALCIUM 20 MG PO TABS: 20.0000 mg | ORAL_TABLET | Freq: Every day | ORAL | 3 refills | Status: DC

## 2016-05-22 MED ORDER — LISINOPRIL 5 MG PO TABS: ORAL_TABLET | ORAL | 3 refills | Status: DC

## 2016-05-22 NOTE — Patient Instructions (Addendum)
  No change in medications for now. Sign up for Mychart tonight as that is the easiest way to receive your results. Plan on follow-up within the next 3-6 months for physical.   IF you received an x-ray today, you will receive an invoice from [facility]. Please contact [facility] at [phone] with questions or concerns regarding your invoice.   IF you received labwork today, you will receive an invoice from [facility]. Please contact [facility] at [phone] with questions or concerns regarding your invoice.   Our billing staff will not be able to assist you with questions regarding bills from these companies.  You will be contacted with the lab results as soon as they are available. The fastest way to get your results is to activate your My Chart account. Instructions are located on the last page of this paperwork. If you have not heard from us regarding the results in 2 weeks, please contact this office.

## 2016-05-22 NOTE — Progress Notes (Signed)
By signing my name below, I, Mesha Guinyard, attest that this documentation has been prepared under the direction and in the presence of Meredith StaggersJeffrey Hephzibah Strehle, MD.  Electronically Signed: Arvilla MarketMesha Guinyard, Medical Scribe. 05/22/16. 4:39 PM.  Subjective:    Patient ID: Aaron Ferguson, male    DOB: 10/31/1960, 55 y.o.   MRN: [id number]  HPI Chief Complaint  Patient presents with  . Diabetes    follow up    HPI Comments: Aaron Ferguson is a 55 y.o. male who presents [facility] the [facility] for DM follow-up. Last visit was in Feb, controlled at that time. Pt is fasting today.   DM: He was continued on Metformin 500 mg QD. He has had elevated microalbumin, and takes lisinopril 5 mg each day. Pt isn't sure  If it's for protein in his kidney or for his blood sugar. Pt was told he doesn't have to check his blood sugar, so he doesn't check it. Pt has a dentist and ophthalmologist that follows him. Pt denies having any diabetic changes in his vision, and diabetic changes in his feet (no neuropathy, or new lesions). Pt doesn't exercise, and doesn't take baby ASA. Lab Results  Component Value Date   HGBA1C 6.8 12/01/2015   Lab Results  Component Value Date   MICROALBUR 1.3 05/22/2015   Wt Readings from Last 3 Encounters:  05/22/16 240 lb (108.9 kg)  12/01/15 234 lb (106.1 kg)  05/22/15 237 lb (107.5 kg)   HLD: Takes Lipitor 20 mg QD. Pt denies experiencing any side affects while on this medication. Pt denies experiencing any negative side affects while on his medication- no chest pain, chest tightness, dizziness, light-headedness, HA, and other symptoms.  Lab Results  Component Value Date   CHOL 152 05/22/2015   HDL 52 05/22/2015   LDLCALC 90 05/22/2015   TRIG 49 05/22/2015   CHOLHDL 2.9 05/22/2015   Lab Results  Component Value Date   ALT 40 05/22/2015   AST 31 05/22/2015   ALKPHOS 41 05/22/2015   BILITOT 0.9 05/22/2015   Patient Active Problem List   Diagnosis Date  Noted  . DM (diabetes mellitus) (HCC) 01/10/2013  . Hyperlipidemia 01/09/2013  . BMI 34.0-34.9,adult 01/09/2013  . GERD (gastroesophageal reflux disease) 01/09/2013   Past Medical History:  Diagnosis Date  . Diabetes mellitus without complication (HCC)   . GERD (gastroesophageal reflux disease)   . Hyperlipidemia    Past Surgical History:  Procedure Laterality Date  . EYE SURGERY     Not on File Prior to Admission medications   Medication Sig Start Date End Date Taking? Authorizing Provider  atorvastatin (LIPITOR) 20 MG tablet Take 1 tablet (20 mg total) by mouth daily at 6 PM. 05/22/15  Yes Tonye Pearsonobert P Doolittle, MD  fish oil-omega-3 fatty acids 1000 MG capsule Take 1 g by mouth daily.   Yes Historical Provider, MD  ibuprofen (ADVIL,MOTRIN) 200 MG tablet Take 400 mg by mouth every 6 (six) hours as needed for mild pain. Reported on 12/01/2015   Yes Historical Provider, MD  lisinopril (PRINIVIL,ZESTRIL) 5 MG tablet TAKE 1 TABLET (5 MG TOTAL) BY MOUTH DAILY. 05/22/15  Yes Tonye Pearsonobert P Doolittle, MD  metFORMIN (GLUCOPHAGE-XR) 500 MG 24 hr tablet TAKE 1 TABLET (500 MG TOTAL) BY MOUTH DAILY WITH BREAKFAST. 05/22/15  Yes Tonye Pearsonobert P Doolittle, MD  diclofenac (VOLTAREN) 75 MG EC tablet Take 1 tablet (75 mg total) by mouth 2 (two) times daily. Patient not taking: Reported on 05/22/2016 08/17/13  Elvina SidleKurt Lauenstein, MD  fluticasone [facility](FLONASE) 50 MCG/ACT nasal spray 1 spray in each nostril twice a day for 2 weeks and then 1 spray in nostril at bedtime for 2 weeks Patient not taking: Reported on 05/22/2016 12/01/15   Tonye Pearsonobert P Doolittle, MD  tetrahydrozoline 0.05 % ophthalmic solution Place 1 drop into both eyes daily.    Historical Provider, MD   Social History   Social History  . Marital status: Married    Spouse name: N/A  . Number of children: N/A  . Years of education: N/A   Occupational History  . Transportation [employer]   Social History Main Topics  . Smoking status: Never Smoker  . Smokeless tobacco:  Not on file  . Alcohol use No  . Drug use: No  . Sexual activity: Not on file   Other Topics Concern  . Not on file   Social History Narrative  . No narrative on file   Depression screen [facility] 2/9 05/22/2016 05/22/2015 04/09/2014  Decreased Interest 0 0 0  Down, Depressed, Hopeless 0 0 0  PHQ - 2 Score 0 0 0   Review of Systems  Constitutional: Negative for fatigue.  Eyes: Negative for visual disturbance.  Respiratory: Negative for cough, chest tightness and shortness of breath.   Cardiovascular: Negative for chest pain, palpitations and leg swelling.  Gastrointestinal: Negative for abdominal pain and blood in stool.  Skin: Negative for rash and wound.  Neurological: Negative for dizziness, light-headedness, numbness and headaches.   Objective:  Physical Exam  Constitutional: He appears well-developed and well-nourished. No distress.  HENT:  Head: Normocephalic and atraumatic.  Eyes: Conjunctivae are normal.  Neck: Neck supple.  Cardiovascular: Normal rate, regular rhythm and normal heart sounds.  Exam reveals no gallop and no friction rub.   No murmur heard. Pulmonary/Chest: Effort normal and breath sounds normal. No respiratory distress. He has no wheezes. He has no rales.  Musculoskeletal: He exhibits no edema.  Neurological: He is alert.  Skin: Skin is warm and dry.  Psychiatric: He has a normal mood and affect. His behavior is normal.  Nursing note and vitals reviewed.  BP 120/74 (BP Location: Left Arm, Patient Position: Sitting, Cuff Size: Large)   Pulse 97   Temp 97.8 F (36.6 C) (Oral)   Resp 16   Ht 5\' 11"  (1.803 m)   Wt 240 lb (108.9 kg)   SpO2 98%   BMI 33.47 kg/m  Assessment & Plan:   Aaron Ferguson is a 55 y.o. male Type 2 diabetes mellitus with other kidney complication (HCC) - Plan: Hemoglobin A1C, Microalbumin, urine, lisinopril (PRINIVIL,ZESTRIL) 5 MG tablet, metFORMIN (GLUCOPHAGE-XR) 500 MG 24 hr tablet  -Previously stable. Continue same dose of  metformin. A1c and microalbumin pending.  Hyperlipidemia - Plan: COMPLETE METABOLIC PANEL WITH GFR, Lipid panel, atorvastatin (LIPITOR) 20 MG tablet  -Tolerating Lipitor. Labs pending. No change in regimen for now.  Meds ordered this encounter  Medications  . atorvastatin (LIPITOR) 20 MG tablet    Sig: Take 1 tablet (20 mg total) by mouth daily at 6 PM.    Dispense:  90 tablet    Refill:  3  . lisinopril (PRINIVIL,ZESTRIL) 5 MG tablet    Sig: TAKE 1 TABLET (5 MG TOTAL) BY MOUTH DAILY.    Dispense:  90 tablet    Refill:  3  . metFORMIN (GLUCOPHAGE-XR) 500 MG 24 hr tablet    Sig: TAKE 1 TABLET (500 MG TOTAL) BY MOUTH DAILY WITH BREAKFAST.  Dispense:  90 tablet    Refill:  3   Patient Instructions    No change in medications for now. Sign up for Mychart tonight as that is the easiest way to receive your results. Plan on follow-up within the next 3-6 months for physical.   IF you received an x-ray today, you will receive an invoice from [facility]. Please contact [facility] at [phone] with questions or concerns regarding your invoice.   IF you received labwork today, you will receive an invoice from [facility]. Please contact [facility] at [phone] with questions or concerns regarding your invoice.   Our billing staff will not be able to assist you with questions regarding bills from these companies.  You will be contacted with the lab results as soon as they are available. The fastest way to get your results is to activate your My Chart account. Instructions are located on the last page of this paperwork. If you have not heard from [place] regarding the results in 2 weeks, please contact this office.       I personally performed the services described in this documentation, which was scribed in my presence. The recorded information has been reviewed and considered, and addended by me as needed.   Signed,   Meredith Staggers,  MD [facility].  05/23/16 2:18 PM

## 2016-05-23 LAB — COMPLETE METABOLIC PANEL WITH GFR
ALBUMIN: 4.4 g/dL (ref 3.6–5.1)
ALK PHOS: 43 U/L (ref 40–115)
ALT: 47 U/L — AB (ref 9–46)
AST: 41 U/L — AB (ref 10–35)
BUN: 9 mg/dL (ref 7–25)
CALCIUM: 9.6 mg/dL (ref 8.6–10.3)
CO2: 24 mmol/L (ref 20–31)
CREATININE: 1.22 mg/dL (ref 0.70–1.33)
Chloride: 103 mmol/L (ref 98–110)
GFR, Est African American: 76 mL/min (ref >=60)
GFR, Est Non African American: 65 mL/min (ref >=60)
Glucose, Bld: 100 mg/dL — ABNORMAL HIGH (ref 65–99)
POTASSIUM: 4.3 mmol/L (ref 3.5–5.3)
Sodium: 137 mmol/L (ref 135–146)
Total Bilirubin: 1.1 mg/dL (ref 0.2–1.2)
Total Protein: 7.4 g/dL (ref 6.1–8.1)

## 2016-05-23 LAB — LIPID PANEL
Cholesterol: 152 mg/dL (ref 125–200)
HDL: 59 mg/dL (ref >=40)
LDL CALC: 83 mg/dL (ref <130)
TRIGLYCERIDES: 49 mg/dL (ref <150)
Total CHOL/HDL Ratio: 2.6 Ratio (ref <=5.0)
VLDL: 10 mg/dL (ref <30)

## 2016-05-23 LAB — HEMOGLOBIN A1C
Hgb A1c MFr Bld: 7 % — ABNORMAL HIGH (ref <5.7)
Mean Plasma Glucose: 154 mg/dL

## 2016-05-23 LAB — MICROALBUMIN, URINE: Microalb, Ur: 0.8 mg/dL

## 2016-06-26 ENCOUNTER — Telehealth: Payer: Self-pay

## 2016-06-26 NOTE — Telephone Encounter (Signed)
Dr. Neva SeatGreene  Patient needs three medications refilled.  Metformin, lisinopril, atorvastatin  [facility]

## 2016-06-26 NOTE — Telephone Encounter (Signed)
Spoke with pt and pharmacy. meds are ready at pharmacy.

## 2016-11-28 ENCOUNTER — Ambulatory Visit (INDEPENDENT_AMBULATORY_CARE_PROVIDER_SITE_OTHER): Payer: Commercial Managed Care - HMO | Admitting: Physician Assistant

## 2016-11-28 VITALS — BP 120/78 | HR 80 | Temp 98.4°F | Resp 16 | Ht 70.0 in | Wt 238.2 lb

## 2016-11-28 DIAGNOSIS — E7800 Pure hypercholesterolemia, unspecified: Secondary | ICD-10-CM | POA: Diagnosis not present

## 2016-11-28 DIAGNOSIS — Z23 Encounter for immunization: Secondary | ICD-10-CM

## 2016-11-28 DIAGNOSIS — R197 Diarrhea, unspecified: Secondary | ICD-10-CM | POA: Diagnosis not present

## 2016-11-28 DIAGNOSIS — R748 Abnormal levels of other serum enzymes: Secondary | ICD-10-CM

## 2016-11-28 DIAGNOSIS — E119 Type 2 diabetes mellitus without complications: Secondary | ICD-10-CM | POA: Diagnosis not present

## 2016-11-28 NOTE — Progress Notes (Signed)
JOSPEH Ferguson  MRN: [id number] DOB: 09-Apr-1961  Subjective:  Pt presents to clinic with diarrhea for the last 1.5 days and to get his medication refilled for his DM.  3 days ago he developed nausea and then vomiting and diarrhea started.  He took some Pepto but it did not help much.  Tmax 101.  Last episode of vomiting was Wednesday am.  Very soft stool this am but it was formed today for the 1st time.  Abd cramping has resolved.  He wanted to make sure that he does not need to do anything else.    DM - he does not check the numbers at home.  Feels goods.  Takes his medications daily as Rx.   Review of Systems  Constitutional: Negative for chills and fever.  Cardiovascular: Negative for chest pain and leg swelling.  Neurological: Negative for numbness.    Patient Active Problem List   Diagnosis Date Noted  . DM (diabetes mellitus) ([place]) 01/10/2013  . Hyperlipidemia 01/09/2013  . BMI 34.0-34.9,adult 01/09/2013  . GERD (gastroesophageal reflux disease) 01/09/2013    Current Outpatient Prescriptions on File Prior to Visit  Medication Sig Dispense Refill  . fish oil-omega-3 fatty acids 1000 MG capsule Take 1 g by mouth daily.     No current facility-administered medications on file prior to visit.     No Known Allergies  Pt patients past, family and social history were reviewed and updated.   Objective:  BP 120/78 (BP Location: Right Arm, Patient Position: Sitting, Cuff Size: Large)   Pulse 80   Temp 98.4 F (36.9 C) (Oral)   Resp 16   Ht 5' 10"  (1.778 m)   Wt 238 lb 3.2 oz (108 kg)   SpO2 98%   BMI 34.18 kg/m   Physical Exam  Constitutional: He is oriented to person, place, and time and well-developed, well-nourished, and in no distress.  HENT:  Head: Normocephalic and atraumatic.  Right Ear: External ear normal.  Left Ear: External ear normal.  Eyes: Conjunctivae are normal.  Neck: Normal range of motion.  Cardiovascular: Normal rate, regular rhythm and normal  heart sounds.   No murmur heard. Pulmonary/Chest: Effort normal and breath sounds normal. He has no wheezes.  Abdominal: Soft. Bowel sounds are normal. He exhibits no distension. There is no tenderness. There is no rebound and no guarding.  Neurological: He is alert and oriented to person, place, and time. Gait normal.  Skin: Skin is warm and dry.  Psychiatric: Mood, memory, affect and judgment normal.   Results for orders placed or performed in visit on 11/28/16  CMP14+EGFR  Result Value Ref Range   Glucose 123 (H) 65 - 99 mg/dL   BUN 12 6 - 24 mg/dL   Creatinine, Ser 1.04 0.76 - 1.27 mg/dL   GFR calc non Af Amer 79 >59 mL/min/1.73   GFR calc Af Amer 92 >59 mL/min/1.73   BUN/Creatinine Ratio 12 9 - 20   Sodium 140 134 - 144 mmol/L   Potassium 4.8 3.5 - 5.2 mmol/L   Chloride 101 96 - 106 mmol/L   CO2 22 18 - 29 mmol/L   Calcium 9.3 8.7 - 10.2 mg/dL   Total Protein 7.5 6.0 - 8.5 g/dL   Albumin 4.4 3.5 - 5.5 g/dL   Globulin, Total 3.1 1.5 - 4.5 g/dL   Albumin/Globulin Ratio 1.4 1.2 - 2.2   Bilirubin Total 0.6 0.0 - 1.2 mg/dL   Alkaline Phosphatase 57 39 - 117 IU/L  AST 44 (H) 0 - 40 IU/L   ALT 60 (H) 0 - 44 IU/L  Lipid panel  Result Value Ref Range   Cholesterol, Total 137 100 - 199 mg/dL   Triglycerides 77 0 - 149 mg/dL   HDL 43 >39 mg/dL   VLDL Cholesterol Cal 15 5 - 40 mg/dL   LDL Calculated 79 0 - 99 mg/dL   Chol/HDL Ratio 3.2 0.0 - 5.0 ratio units  Hemoglobin A1c  Result Value Ref Range   Hgb A1c MFr Bld 7.4 (H) 4.8 - 5.6 %   Est. average glucose Bld gHb Est-mCnc 166 mg/dL    Assessment and Plan :  Type 2 diabetes mellitus without complication, without long-term current use of insulin (HCC) - Plan: CMP14+EGFR, Hemoglobin A1c, metFORMIN (GLUCOPHAGE-XR) 500 MG 24 hr tablet, lisinopril (PRINIVIL,ZESTRIL) 5 MG tablet  Pure hypercholesterolemia - Plan: Lipid panel, atorvastatin (LIPITOR) 20 MG tablet  Diarrhea, unspecified type  Recheck in 6 months due to good DM  control  Windell Hummingbird PA-C  [facility] [place] 12/01/2016 8:34 PM

## 2016-11-28 NOTE — Patient Instructions (Addendum)
I will contact you with your lab results as soon as they are available.   If you have not heard from me in 2 weeks, please contact me.  The fastest way to get your results is to register for My Chart (see the instructions on the last page of this printout).   IF you received an x-ray today, you will receive an invoice from [facility]. Please contact [facility] at [phone] with questions or concerns regarding your invoice.   IF you received labwork today, you will receive an invoice from [facility]. Please contact [facility] at [phone] with questions or concerns regarding your invoice.   Our billing staff will not be able to assist you with questions regarding bills from these companies.  You will be contacted with the lab results as soon as they are available. The fastest way to get your results is to activate your My Chart account. Instructions are located on the last page of this paperwork. If you have not heard from us regarding the results in 2 weeks, please contact this office.      

## 2016-11-29 LAB — HEMOGLOBIN A1C
Est. average glucose Bld gHb Est-mCnc: 166 mg/dL
HEMOGLOBIN A1C: 7.4 % — AB (ref 4.8–5.6)

## 2016-11-29 LAB — CMP14+EGFR
A/G RATIO: 1.4 (ref 1.2–2.2)
ALBUMIN: 4.4 g/dL (ref 3.5–5.5)
ALT: 60 IU/L — ABNORMAL HIGH (ref 0–44)
AST: 44 IU/L — ABNORMAL HIGH (ref 0–40)
Alkaline Phosphatase: 57 IU/L (ref 39–117)
BUN / CREAT RATIO: 12 (ref 9–20)
BUN: 12 mg/dL (ref 6–24)
Bilirubin Total: 0.6 mg/dL (ref 0.0–1.2)
CALCIUM: 9.3 mg/dL (ref 8.7–10.2)
CO2: 22 mmol/L (ref 18–29)
Chloride: 101 mmol/L (ref 96–106)
Creatinine, Ser: 1.04 mg/dL (ref 0.76–1.27)
GFR, EST AFRICAN AMERICAN: 92 mL/min/{1.73_m2} (ref >59)
GFR, EST NON AFRICAN AMERICAN: 79 mL/min/{1.73_m2} (ref >59)
GLOBULIN, TOTAL: 3.1 g/dL (ref 1.5–4.5)
Glucose: 123 mg/dL — ABNORMAL HIGH (ref 65–99)
Potassium: 4.8 mmol/L (ref 3.5–5.2)
SODIUM: 140 mmol/L (ref 134–144)
TOTAL PROTEIN: 7.5 g/dL (ref 6.0–8.5)

## 2016-11-29 LAB — LIPID PANEL
CHOL/HDL RATIO: 3.2 ratio (ref 0.0–5.0)
Cholesterol, Total: 137 mg/dL (ref 100–199)
HDL: 43 mg/dL (ref >39)
LDL Calculated: 79 mg/dL (ref 0–99)
Triglycerides: 77 mg/dL (ref 0–149)
VLDL Cholesterol Cal: 15 mg/dL (ref 5–40)

## 2016-12-01 MED ORDER — METFORMIN HCL ER 500 MG PO TB24: ORAL_TABLET | ORAL | 1 refills | Status: DC

## 2016-12-01 MED ORDER — ATORVASTATIN CALCIUM 20 MG PO TABS: 20.0000 mg | ORAL_TABLET | Freq: Every day | ORAL | 1 refills | Status: DC

## 2016-12-01 MED ORDER — LISINOPRIL 5 MG PO TABS: ORAL_TABLET | ORAL | 1 refills | Status: DC

## 2016-12-03 MED ORDER — ATORVASTATIN CALCIUM 20 MG PO TABS: 20.0000 mg | ORAL_TABLET | Freq: Every day | ORAL | 1 refills | Status: DC

## 2016-12-03 NOTE — Addendum Note (Signed)
Addended by: Morrell RiddleWEBER, SARAH L on: 12/03/2016 09:41 AM   Modules accepted: Orders

## 2016-12-03 NOTE — Addendum Note (Signed)
Addended by: Baldwin CrownJOHNSON, Cheyna Retana D on: 12/03/2016 10:54 AM   Modules accepted: Orders

## 2016-12-04 ENCOUNTER — Encounter: Payer: Self-pay | Admitting: Physician Assistant

## 2016-12-04 LAB — SPECIMEN STATUS REPORT

## 2016-12-04 LAB — HEPATITIS B SURFACE ANTIGEN: HEP B S AG: NEGATIVE

## 2016-12-04 MED ORDER — METFORMIN HCL ER 500 MG PO TB24: 1000.0000 mg | ORAL_TABLET | Freq: Every day | ORAL | 0 refills | Status: DC

## 2016-12-04 NOTE — Addendum Note (Signed)
Addended by: Morrell RiddleWEBER, Azyriah Nevins L on: 12/04/2016 01:43 PM   Modules accepted: Orders

## 2017-03-03 ENCOUNTER — Telehealth: Payer: Self-pay | Admitting: Physician Assistant

## 2017-03-03 DIAGNOSIS — E119 Type 2 diabetes mellitus without complications: Secondary | ICD-10-CM

## 2017-03-03 DIAGNOSIS — E7800 Pure hypercholesterolemia, unspecified: Secondary | ICD-10-CM

## 2017-03-03 MED ORDER — ATORVASTATIN CALCIUM 20 MG PO TABS: 20.0000 mg | ORAL_TABLET | Freq: Every day | ORAL | 0 refills | Status: DC

## 2017-03-03 MED ORDER — LISINOPRIL 5 MG PO TABS: ORAL_TABLET | ORAL | 0 refills | Status: DC

## 2017-03-03 MED ORDER — METFORMIN HCL ER 500 MG PO TB24: 1000.0000 mg | ORAL_TABLET | Freq: Every day | ORAL | 0 refills | Status: DC

## 2017-03-03 NOTE — Telephone Encounter (Signed)
Pt is needing to check on the request for refill of his metformin, lisinopril and lipitor   Best number [phone]

## 2017-03-17 ENCOUNTER — Telehealth: Payer: Self-pay | Admitting: Physician Assistant

## 2017-03-17 NOTE — Telephone Encounter (Signed)
Phone call from the answering service - pt's medications we not at the pharmacy - I reviewed his chart and his medications were sent to the pharmacy on 5/29 - can we check on this please.

## 2017-03-18 ENCOUNTER — Other Ambulatory Visit: Payer: Self-pay | Admitting: Physician Assistant

## 2017-03-18 DIAGNOSIS — E119 Type 2 diabetes mellitus without complications: Secondary | ICD-10-CM

## 2017-03-19 NOTE — Telephone Encounter (Deleted)
Please call pharmacy and check on the 3 meds that I sent on 5/29 --

## 2017-03-19 NOTE — Telephone Encounter (Signed)
We have already spoken to the pharmacy and they have the Rx.

## 2017-05-27 ENCOUNTER — Other Ambulatory Visit: Payer: Self-pay | Admitting: Physician Assistant

## 2017-05-27 DIAGNOSIS — E119 Type 2 diabetes mellitus without complications: Secondary | ICD-10-CM

## 2017-06-15 ENCOUNTER — Other Ambulatory Visit: Payer: Self-pay

## 2017-06-15 ENCOUNTER — Telehealth: Payer: Self-pay | Admitting: Physician Assistant

## 2017-06-15 DIAGNOSIS — E119 Type 2 diabetes mellitus without complications: Secondary | ICD-10-CM

## 2017-06-15 MED ORDER — METFORMIN HCL ER 500 MG PO TB24: 1000.0000 mg | ORAL_TABLET | Freq: Every day | ORAL | 0 refills | Status: DC

## 2017-06-15 NOTE — Telephone Encounter (Signed)
RX sent in

## 2017-06-15 NOTE — Telephone Encounter (Signed)
Aaron Ferguson - Pt says a request was sent for his metformin refill, but has not heard anything.  He ran out today.  Please refill [phone]

## 2017-06-20 ENCOUNTER — Other Ambulatory Visit: Payer: Self-pay | Admitting: Physician Assistant

## 2017-06-20 DIAGNOSIS — E119 Type 2 diabetes mellitus without complications: Secondary | ICD-10-CM

## 2017-06-22 NOTE — Telephone Encounter (Signed)
Patient needs to schedule a follow up appointment for diabetic medication.

## 2017-07-14 ENCOUNTER — Other Ambulatory Visit: Payer: Self-pay | Admitting: Physician Assistant

## 2017-07-14 DIAGNOSIS — E7800 Pure hypercholesterolemia, unspecified: Secondary | ICD-10-CM

## 2017-07-14 DIAGNOSIS — E119 Type 2 diabetes mellitus without complications: Secondary | ICD-10-CM

## 2017-07-15 ENCOUNTER — Telehealth: Payer: Self-pay | Admitting: Physician Assistant

## 2017-07-15 NOTE — Telephone Encounter (Signed)
Pt was denied his metformin due to needing an appointment.  He took his last one today.  I have made him an appointment with you for Monday.  Can we get him a few pills refilled to last him until then?  [phone]

## 2017-07-16 ENCOUNTER — Other Ambulatory Visit: Payer: Self-pay

## 2017-07-16 DIAGNOSIS — E119 Type 2 diabetes mellitus without complications: Secondary | ICD-10-CM

## 2017-07-16 MED ORDER — METFORMIN HCL ER 500 MG PO TB24
1000.0000 mg | ORAL_TABLET | Freq: Every day | ORAL | 0 refills | Status: DC
Start: 2017-07-16 — End: 2017-07-21

## 2017-07-16 NOTE — Telephone Encounter (Signed)
done

## 2017-07-20 ENCOUNTER — Ambulatory Visit (INDEPENDENT_AMBULATORY_CARE_PROVIDER_SITE_OTHER): Payer: 59 | Admitting: Physician Assistant

## 2017-07-20 ENCOUNTER — Encounter: Payer: Self-pay | Admitting: Physician Assistant

## 2017-07-20 VITALS — BP 128/82 | HR 90 | Temp 97.9°F | Resp 18 | Ht 70.0 in | Wt 238.4 lb

## 2017-07-20 DIAGNOSIS — E7800 Pure hypercholesterolemia, unspecified: Secondary | ICD-10-CM

## 2017-07-20 DIAGNOSIS — E119 Type 2 diabetes mellitus without complications: Secondary | ICD-10-CM | POA: Diagnosis not present

## 2017-07-20 NOTE — Progress Notes (Addendum)
Aaron Ferguson  MRN: [id number] DOB: 08-Jun-1961  PCP: Mancel Bale, PA-C  Chief Complaint  Patient presents with  . Medication Refill    all meds     Subjective:  Pt presents to clinic for medication refill and lab check.  He feels good.  He does not check his glucose at home.    Last ate last night.  History is obtained by patient.  Review of Systems  Constitutional: Negative for chills and fever.  Cardiovascular: Negative for chest pain and leg swelling.  Neurological: Negative for numbness.    Patient Active Problem List   Diagnosis Date Noted  . DM (diabetes mellitus) ([place]) 01/10/2013  . Hyperlipidemia 01/09/2013  . BMI 34.0-34.9,adult 01/09/2013  . GERD (gastroesophageal reflux disease) 01/09/2013    Current Outpatient Prescriptions on File Prior to Visit  Medication Sig Dispense Refill  . fish oil-omega-3 fatty acids 1000 MG capsule Take 1 g by mouth daily.     No current facility-administered medications on file prior to visit.     No Known Allergies  Past Medical History:  Diagnosis Date  . Diabetes mellitus without complication ([place])   . GERD (gastroesophageal reflux disease)   . Hyperlipidemia    Social History   Social History Narrative  . No narrative on file   Social History  Substance Use Topics  . Smoking status: Former Smoker    Packs/day: 0.50    Types: Cigarettes    Quit date: 10/06/1978  . Smokeless tobacco: Never Used  . Alcohol use No   family history includes Diabetes in his mother and sister; Mental illness in his mother.     Objective:  BP 128/82   Pulse 90   Temp 97.9 F (36.6 C) (Oral)   Resp 18   Ht _0  (1.778 m)   Wt 238 lb 6.4 oz (108.1 kg)   SpO2 95%   BMI 34.21 kg/m  Body mass index is 34.21 kg/m.  Physical Exam  Constitutional: He is oriented to person, place, and time and well-developed, well-nourished, and in no distress.  HENT:  Head: Normocephalic and atraumatic.  Right Ear: Hearing, tympanic  membrane, external ear and ear canal normal.  Left Ear: Hearing, tympanic membrane, external ear and ear canal normal.  Nose: Nose normal.  Mouth/Throat: Uvula is midline, oropharynx is clear and moist and mucous membranes are normal.  Eyes: Pupils are equal, round, and reactive to light. Conjunctivae and EOM are normal.  Neck: Trachea normal and normal range of motion. Neck supple. No thyroid mass and no thyromegaly present.  Cardiovascular: Normal rate, regular rhythm and normal heart sounds.   No murmur heard. Pulmonary/Chest: Effort normal and breath sounds normal.  Abdominal: Soft. Bowel sounds are normal.  Musculoskeletal: Normal range of motion.  Neurological: He is alert and oriented to person, place, and time. Gait normal.  Skin: Skin is warm and dry.  Psychiatric: Mood, memory, affect and judgment normal.    Wt Readings from Last 3 Encounters:  07/20/17 238 lb 6.4 oz (108.1 kg)  11/28/16 238 lb 3.2 oz (108 kg)  05/22/16 240 lb (108.9 kg)     Assessment and Plan :  Type 2 diabetes mellitus without complication, without long-term current use of insulin (HCC) - Plan: CMP14+EGFR, Hemoglobin A1c, Microalbumin, urine, HM DIABETES FOOT EXAM - increase dose to 161m bid to help control DM better.  Pure hypercholesterolemia - Plan: Lipid panel - labs are good - continue medication as rx  Pt is  doing well.  He is taking his medication as rx but he did not increase his metformin to bid dosing so I suspect that his A1C will be elevated at this appt.  He was encouraged to continue to monitor his diet to help decrease worsening of his medical conditions.  He will recheck with me in 3 months.  Windell Hummingbird PA-C  [facility] 07/21/2017 5:15 PM

## 2017-07-20 NOTE — Patient Instructions (Signed)
     IF you received an x-ray today, you will receive an invoice from [facility]. Please contact [facility] at [phone] with questions or concerns regarding your invoice.   IF you received labwork today, you will receive an invoice from [facility]. Please contact [facility] at [phone] with questions or concerns regarding your invoice.   Our billing staff will not be able to assist you with questions regarding bills from these companies.  You will be contacted with the lab results as soon as they are available. The fastest way to get your results is to activate your My Chart account. Instructions are located on the last page of this paperwork. If you have not heard from us regarding the results in 2 weeks, please contact this office.     

## 2017-07-21 LAB — CMP14+EGFR
ALBUMIN: 5.1 g/dL (ref 3.5–5.5)
ALT: 63 IU/L — AB (ref 0–44)
AST: 52 IU/L — ABNORMAL HIGH (ref 0–40)
Albumin/Globulin Ratio: 1.6 (ref 1.2–2.2)
Alkaline Phosphatase: 61 IU/L (ref 39–117)
BILIRUBIN TOTAL: 0.8 mg/dL (ref 0.0–1.2)
BUN / CREAT RATIO: 10 (ref 9–20)
BUN: 11 mg/dL (ref 6–24)
CALCIUM: 10 mg/dL (ref 8.7–10.2)
CHLORIDE: 103 mmol/L (ref 96–106)
CO2: 21 mmol/L (ref 20–29)
Creatinine, Ser: 1.13 mg/dL (ref 0.76–1.27)
GFR, EST AFRICAN AMERICAN: 82 mL/min/{1.73_m2} (ref >59)
GFR, EST NON AFRICAN AMERICAN: 71 mL/min/{1.73_m2} (ref >59)
Globulin, Total: 3.1 g/dL (ref 1.5–4.5)
Glucose: 109 mg/dL — ABNORMAL HIGH (ref 65–99)
Potassium: 4.8 mmol/L (ref 3.5–5.2)
Sodium: 141 mmol/L (ref 134–144)
TOTAL PROTEIN: 8.2 g/dL (ref 6.0–8.5)

## 2017-07-21 LAB — LIPID PANEL
CHOLESTEROL TOTAL: 156 mg/dL (ref 100–199)
Chol/HDL Ratio: 2.8 ratio (ref 0.0–5.0)
HDL: 55 mg/dL (ref >39)
LDL CALC: 89 mg/dL (ref 0–99)
TRIGLYCERIDES: 58 mg/dL (ref 0–149)
VLDL CHOLESTEROL CAL: 12 mg/dL (ref 5–40)

## 2017-07-21 LAB — HEMOGLOBIN A1C
Est. average glucose Bld gHb Est-mCnc: 157 mg/dL
Hgb A1c MFr Bld: 7.1 % — ABNORMAL HIGH (ref 4.8–5.6)

## 2017-07-21 LAB — MICROALBUMIN, URINE: Microalbumin, Urine: 24.6 ug/mL

## 2017-07-21 MED ORDER — ATORVASTATIN CALCIUM 20 MG PO TABS: 20.0000 mg | ORAL_TABLET | Freq: Every day | ORAL | 0 refills | Status: DC

## 2017-07-21 MED ORDER — LISINOPRIL 5 MG PO TABS: 5.0000 mg | ORAL_TABLET | Freq: Every day | ORAL | 0 refills | Status: DC

## 2017-07-21 MED ORDER — METFORMIN HCL ER 500 MG PO TB24: 1000.0000 mg | ORAL_TABLET | Freq: Two times a day (BID) | ORAL | 0 refills | Status: DC

## 2017-07-21 NOTE — Addendum Note (Signed)
Addended by: Morrell Riddle on: 07/21/2017 05:15 PM   Modules accepted: Orders

## 2017-07-30 DIAGNOSIS — E119 Type 2 diabetes mellitus without complications: Secondary | ICD-10-CM | POA: Diagnosis not present

## 2017-07-30 DIAGNOSIS — H524 Presbyopia: Secondary | ICD-10-CM | POA: Diagnosis not present

## 2017-10-09 ENCOUNTER — Telehealth: Payer: Self-pay | Admitting: Physician Assistant

## 2017-10-09 NOTE — Telephone Encounter (Signed)
Called pt to reschedule his appt with Benny LennertSarah Weber on 10/20/17 to a different day. Maralyn SagoSarah will be in the office until 12:00 noon until 10/28/17. Until then, please only make appts (last appt at 11:40).   You amy make appts full days after 10/28/17  Thanks!

## 2017-10-20 ENCOUNTER — Ambulatory Visit: Payer: 59 | Admitting: Physician Assistant

## 2017-10-30 ENCOUNTER — Encounter: Payer: Self-pay | Admitting: Physician Assistant

## 2017-10-30 ENCOUNTER — Other Ambulatory Visit: Payer: Self-pay

## 2017-10-30 ENCOUNTER — Ambulatory Visit: Payer: 59 | Admitting: Physician Assistant

## 2017-10-30 VITALS — BP 120/70 | HR 80 | Temp 97.7°F | Resp 18 | Ht 70.0 in | Wt 237.8 lb

## 2017-10-30 DIAGNOSIS — R748 Abnormal levels of other serum enzymes: Secondary | ICD-10-CM

## 2017-10-30 DIAGNOSIS — E119 Type 2 diabetes mellitus without complications: Secondary | ICD-10-CM | POA: Diagnosis not present

## 2017-10-30 DIAGNOSIS — E7800 Pure hypercholesterolemia, unspecified: Secondary | ICD-10-CM | POA: Diagnosis not present

## 2017-10-30 MED ORDER — ATORVASTATIN CALCIUM 20 MG PO TABS: 20.0000 mg | ORAL_TABLET | Freq: Every day | ORAL | 0 refills | Status: DC

## 2017-10-30 MED ORDER — METFORMIN HCL ER 500 MG PO TB24: 1000.0000 mg | ORAL_TABLET | Freq: Two times a day (BID) | ORAL | 0 refills | Status: DC

## 2017-10-30 MED ORDER — LISINOPRIL 5 MG PO TABS: 5.0000 mg | ORAL_TABLET | Freq: Every day | ORAL | 0 refills | Status: DC

## 2017-10-30 NOTE — Progress Notes (Signed)
   Aaron Ferguson  MRN: [id number] DOB: 1961-01-10  PCP: Mancel Bale, PA-C  Chief Complaint  Patient presents with  . Diabetes    follow up     Subjective:  Pt presents to clinic for medication refill.  He is doing good.  Taking medications like he is supposed.  No problems with medication.  He is not checking his glucose at home.  No ETOH.  Uses no tylenol. If his liver enzymes are still elevated he would like to proceed with the liver US.  History is obtained by patient.  Review of Systems  Constitutional: Negative for chills and fever.  Eyes: Negative for visual disturbance.  Respiratory: Negative for cough and shortness of breath.   Cardiovascular: Negative for chest pain, palpitations and leg swelling.  Neurological: Negative for dizziness, light-headedness, numbness and headaches.    Patient Active Problem List   Diagnosis Date Noted  . DM (diabetes mellitus) ([place]) 01/10/2013  . Hyperlipidemia 01/09/2013  . BMI 34.0-34.9,adult 01/09/2013  . GERD (gastroesophageal reflux disease) 01/09/2013    Current Outpatient Medications on File Prior to Visit  Medication Sig Dispense Refill  . fish oil-omega-3 fatty acids 1000 MG capsule Take 1 g by mouth daily.     No current facility-administered medications on file prior to visit.     No Known Allergies  Past Medical History:  Diagnosis Date  . Diabetes mellitus without complication ([place])   . GERD (gastroesophageal reflux disease)   . Hyperlipidemia    Social History   Social History Narrative  . Not on file   Social History   Tobacco Use  . Smoking status: Former Smoker    Packs/day: 0.50    Types: Cigarettes    Last attempt to quit: 10/06/1978    Years since quitting: 39.0  . Smokeless tobacco: Never Used  Substance Use Topics  . Alcohol use: No  . Drug use: No   family history includes Diabetes in his mother and sister; Mental illness in his mother.     Objective:  BP 120/70   Pulse 80   Temp  97.7 F (36.5 C) (Oral)   Resp 18   Ht _0  (1.778 m)   Wt 237 lb 12.8 oz (107.9 kg)   SpO2 99%   BMI 34.12 kg/m  Body mass index is 34.12 kg/m.  Physical Exam  Constitutional: He is oriented to person, place, and time and well-developed, well-nourished, and in no distress.  HENT:  Head: Normocephalic and atraumatic.  Right Ear: External ear normal.  Left Ear: External ear normal.  Eyes: Conjunctivae are normal.  Neck: Normal range of motion.  Cardiovascular: Normal rate, regular rhythm and normal heart sounds.  Pulmonary/Chest: Effort normal and breath sounds normal.  Neurological: He is alert and oriented to person, place, and time. Gait normal.  Skin: Skin is warm and dry.  Psychiatric: Mood, memory, affect and judgment normal.    Assessment and Plan :  Type 2 diabetes mellitus without complication, without long-term current use of insulin (HCC) - Plan: CMP14+EGFR, Hemoglobin A1c, metFORMIN (GLUCOPHAGE-XR) 500 MG 24 hr tablet, lisinopril (PRINIVIL,ZESTRIL) 5 MG tablet - continue current medications and will add if needed based on labs  Pure hypercholesterolemia - Plan: CMP14+EGFR, atorvastatin (LIPITOR) 20 MG tablet - continue medications - adjust if needed  Elevated liver enzymes - Plan: CMP14+EGFR - recheck - if still elevated  Windell Hummingbird PA-C  [facility] 10/30/2017 11:31 AM

## 2017-10-30 NOTE — Patient Instructions (Signed)
     IF you received an x-ray today, you will receive an invoice from [facility]. Please contact [facility] at [phone] with questions or concerns regarding your invoice.   IF you received labwork today, you will receive an invoice from [facility]. Please contact [facility] at [phone] with questions or concerns regarding your invoice.   Our billing staff will not be able to assist you with questions regarding bills from these companies.  You will be contacted with the lab results as soon as they are available. The fastest way to get your results is to activate your My Chart account. Instructions are located on the last page of this paperwork. If you have not heard from us regarding the results in 2 weeks, please contact this office.     

## 2017-10-31 LAB — CMP14+EGFR
A/G RATIO: 1.6 (ref 1.2–2.2)
ALT: 59 IU/L — ABNORMAL HIGH (ref 0–44)
AST: 46 IU/L — ABNORMAL HIGH (ref 0–40)
Albumin: 4.6 g/dL (ref 3.5–5.5)
Alkaline Phosphatase: 52 IU/L (ref 39–117)
BILIRUBIN TOTAL: 0.8 mg/dL (ref 0.0–1.2)
BUN/Creatinine Ratio: 9 (ref 9–20)
BUN: 9 mg/dL (ref 6–24)
CALCIUM: 9.4 mg/dL (ref 8.7–10.2)
CO2: 20 mmol/L (ref 20–29)
Chloride: 101 mmol/L (ref 96–106)
Creatinine, Ser: 1.02 mg/dL (ref 0.76–1.27)
GFR, EST AFRICAN AMERICAN: 93 mL/min/{1.73_m2} (ref >59)
GFR, EST NON AFRICAN AMERICAN: 81 mL/min/{1.73_m2} (ref >59)
GLOBULIN, TOTAL: 2.9 g/dL (ref 1.5–4.5)
Glucose: 93 mg/dL (ref 65–99)
POTASSIUM: 4.4 mmol/L (ref 3.5–5.2)
SODIUM: 141 mmol/L (ref 134–144)
TOTAL PROTEIN: 7.5 g/dL (ref 6.0–8.5)

## 2017-10-31 LAB — HEMOGLOBIN A1C
Est. average glucose Bld gHb Est-mCnc: 146 mg/dL
Hgb A1c MFr Bld: 6.7 % — ABNORMAL HIGH (ref 4.8–5.6)

## 2018-02-05 ENCOUNTER — Encounter: Payer: Self-pay | Admitting: Physician Assistant

## 2018-02-05 ENCOUNTER — Ambulatory Visit: Payer: 59 | Admitting: Physician Assistant

## 2018-02-05 ENCOUNTER — Other Ambulatory Visit: Payer: Self-pay

## 2018-02-05 VITALS — BP 128/78 | HR 86 | Temp 98.0°F | Resp 16 | Ht 71.26 in | Wt 228.0 lb

## 2018-02-05 DIAGNOSIS — R748 Abnormal levels of other serum enzymes: Secondary | ICD-10-CM

## 2018-02-05 DIAGNOSIS — E119 Type 2 diabetes mellitus without complications: Secondary | ICD-10-CM

## 2018-02-05 DIAGNOSIS — E7800 Pure hypercholesterolemia, unspecified: Secondary | ICD-10-CM

## 2018-02-05 NOTE — Progress Notes (Addendum)
Aaron Ferguson  MRN: [id number] DOB: 09-Apr-1961  PCP: Mancel Bale, PA-C  Chief Complaint  Patient presents with  . Diabetes    3 month follow-up   . Hyperlipidemia    Subjective:  Pt presents to clinic for DM and high cholesterol.  He is tolerating his medication good.  He does not check his glucose at home.  Over the last 2 months he has changed what he is eating and the time that he is eating and he has lost some weight.  He drinks a lot of water.   Plan review of his labs he started having elevated liver enzymes approximately a year ago they have remained basically the same since that period of time.  He uses no alcohol.  He has been on Lipitor for a longer period of time.  According to our records he has been on Lipitor since 2014.  He has no abdominal pain or yellowing of his skin.  He has had negative hepatitis B and C testing within the last 2 years.  History is obtained by patient.  Review of Systems  Constitutional: Negative for chills and fever.  Eyes: Negative for visual disturbance.  Respiratory: Negative for cough and shortness of breath.   Cardiovascular: Negative for chest pain, palpitations and leg swelling.  Neurological: Negative for dizziness, light-headedness, numbness and headaches.    Patient Active Problem List   Diagnosis Date Noted  . DM (diabetes mellitus) ([place]) 01/10/2013  . Hyperlipidemia 01/09/2013  . BMI 34.0-34.9,adult 01/09/2013  . GERD (gastroesophageal reflux disease) 01/09/2013    Current Outpatient Medications on File Prior to Visit  Medication Sig Dispense Refill  . atorvastatin (LIPITOR) 20 MG tablet Take 1 tablet (20 mg total) by mouth daily at 6 PM. 90 tablet 0  . fish oil-omega-3 fatty acids 1000 MG capsule Take 1 g by mouth daily.    Marland Kitchen lisinopril (PRINIVIL,ZESTRIL) 5 MG tablet Take 1 tablet (5 mg total) by mouth daily. 90 tablet 0  . metFORMIN (GLUCOPHAGE-XR) 500 MG 24 hr tablet Take 2 tablets (1,000 mg total) by mouth 2 (two)  times daily. 540 tablet 0   No current facility-administered medications on file prior to visit.     No Known Allergies  Past Medical History:  Diagnosis Date  . Diabetes mellitus without complication ([place])   . GERD (gastroesophageal reflux disease)   . Hyperlipidemia    Social History   Social History Narrative   Lives with wife      Bowls for fun   Social History   Tobacco Use  . Smoking status: Former Smoker    Packs/day: 0.50    Types: Cigarettes    Last attempt to quit: 10/06/1978    Years since quitting: 39.3  . Smokeless tobacco: Never Used  Substance Use Topics  . Alcohol use: No  . Drug use: No   family history includes Diabetes in his mother and sister; Mental illness in his mother.     Objective:  BP 128/78   Pulse 86   Temp 98 F (36.7 C) (Oral)   Resp 16   Ht 5' 11.26" (1.81 m)   Wt 228 lb (103.4 kg)   SpO2 98%   BMI 31.57 kg/m  Body mass index is 31.57 kg/m.   Wt Readings from Last 3 Encounters:  02/05/18 228 lb (103.4 kg)  10/30/17 237 lb 12.8 oz (107.9 kg)  07/20/17 238 lb 6.4 oz (108.1 kg)    Physical Exam  Constitutional: He  is oriented to person, place, and time. He appears well-developed and well-nourished.  HENT:  Head: Normocephalic and atraumatic.  Right Ear: External ear normal.  Left Ear: External ear normal.  Eyes: Conjunctivae are normal.  Neck: Normal range of motion.  Cardiovascular: Normal rate, regular rhythm, normal heart sounds and intact distal pulses.  Pulmonary/Chest: Effort normal and breath sounds normal. He has no wheezes.  Musculoskeletal:       Right lower leg: He exhibits no edema.       Left lower leg: He exhibits no edema.  Neurological: He is alert and oriented to person, place, and time.  Skin: Skin is warm and dry.  Psychiatric: He has a normal mood and affect. His behavior is normal. Judgment and thought content normal.  Vitals reviewed.   Assessment and Plan :  Type 2 diabetes mellitus without  complication, without long-term current use of insulin ([place]) - Plan: CMP14+EGFR, Hemoglobin A1c -  Check labs he has had well-controlled diabetes for several years.  Expect this to be the case of these labs will send in medication needed at that point.  Pure hypercholesterolemia - Plan: CMP14+EGFR, Lipid panel -  He has made significant changes in his diet especially over the last 2 months showing a 10 pound weight loss. Cholesterol has been well controlled since starting Lipitor.  Elevated liver enzymes - Plan: CMP14+EGFR -this has stayed the same for about the last year and a half.  We will recheck again.  Depending on his cholesterol level at this time we may try to decrease his Lipitor to see if that makes a difference in his liver enzymes so he has been on it for a significant period of time without elevated liver enzymes.  All medications were refills as labs looked great  Windell Hummingbird PA-C  [facility] 02/05/2018 8:29 AM

## 2018-02-05 NOTE — Patient Instructions (Signed)
     IF you received an x-ray today, you will receive an invoice from [facility]. Please contact [facility] at [phone] with questions or concerns regarding your invoice.   IF you received labwork today, you will receive an invoice from [facility]. Please contact [facility] at [phone] with questions or concerns regarding your invoice.   Our billing staff will not be able to assist you with questions regarding bills from these companies.  You will be contacted with the lab results as soon as they are available. The fastest way to get your results is to activate your My Chart account. Instructions are located on the last page of this paperwork. If you have not heard from us regarding the results in 2 weeks, please contact this office.     

## 2018-02-06 LAB — CMP14+EGFR
ALBUMIN: 4.5 g/dL (ref 3.5–5.5)
ALK PHOS: 47 IU/L (ref 39–117)
ALT: 36 IU/L (ref 0–44)
AST: 37 IU/L (ref 0–40)
Albumin/Globulin Ratio: 1.6 (ref 1.2–2.2)
BILIRUBIN TOTAL: 0.7 mg/dL (ref 0.0–1.2)
BUN / CREAT RATIO: 11 (ref 9–20)
BUN: 13 mg/dL (ref 6–24)
CHLORIDE: 101 mmol/L (ref 96–106)
CO2: 23 mmol/L (ref 20–29)
CREATININE: 1.15 mg/dL (ref 0.76–1.27)
Calcium: 9.5 mg/dL (ref 8.7–10.2)
GFR calc Af Amer: 81 mL/min/{1.73_m2} (ref >59)
GFR calc non Af Amer: 70 mL/min/{1.73_m2} (ref >59)
GLUCOSE: 95 mg/dL (ref 65–99)
Globulin, Total: 2.8 g/dL (ref 1.5–4.5)
Potassium: 5 mmol/L (ref 3.5–5.2)
Sodium: 140 mmol/L (ref 134–144)
Total Protein: 7.3 g/dL (ref 6.0–8.5)

## 2018-02-06 LAB — LIPID PANEL
CHOL/HDL RATIO: 2.6 ratio (ref 0.0–5.0)
CHOLESTEROL TOTAL: 137 mg/dL (ref 100–199)
HDL: 53 mg/dL (ref >39)
LDL Calculated: 75 mg/dL (ref 0–99)
Triglycerides: 43 mg/dL (ref 0–149)
VLDL Cholesterol Cal: 9 mg/dL (ref 5–40)

## 2018-02-06 LAB — HEMOGLOBIN A1C
ESTIMATED AVERAGE GLUCOSE: 128 mg/dL
HEMOGLOBIN A1C: 6.1 % — AB (ref 4.8–5.6)

## 2018-02-10 MED ORDER — METFORMIN HCL ER 500 MG PO TB24: 1000.0000 mg | ORAL_TABLET | Freq: Two times a day (BID) | ORAL | 3 refills | Status: DC

## 2018-02-10 MED ORDER — ATORVASTATIN CALCIUM 20 MG PO TABS: 20.0000 mg | ORAL_TABLET | Freq: Every day | ORAL | 3 refills | Status: DC

## 2018-02-10 MED ORDER — LISINOPRIL 5 MG PO TABS: 5.0000 mg | ORAL_TABLET | Freq: Every day | ORAL | 3 refills | Status: DC

## 2018-02-10 NOTE — Addendum Note (Signed)
Addended by: Morrell Riddle on: 02/10/2018 12:45 AM   Modules accepted: Orders

## 2018-05-11 ENCOUNTER — Ambulatory Visit: Payer: 59 | Admitting: Physician Assistant

## 2018-08-04 ENCOUNTER — Encounter: Payer: Self-pay | Admitting: Family Medicine

## 2018-08-04 ENCOUNTER — Ambulatory Visit (INDEPENDENT_AMBULATORY_CARE_PROVIDER_SITE_OTHER): Payer: 59 | Admitting: Family Medicine

## 2018-08-04 ENCOUNTER — Other Ambulatory Visit: Payer: Self-pay

## 2018-08-04 VITALS — BP 150/91 | HR 63 | Temp 97.4°F | Resp 16 | Ht 71.26 in | Wt 226.2 lb

## 2018-08-04 DIAGNOSIS — Z23 Encounter for immunization: Secondary | ICD-10-CM

## 2018-08-04 DIAGNOSIS — E119 Type 2 diabetes mellitus without complications: Secondary | ICD-10-CM

## 2018-08-04 DIAGNOSIS — M7632 Iliotibial band syndrome, left leg: Secondary | ICD-10-CM | POA: Diagnosis not present

## 2018-08-04 DIAGNOSIS — R252 Cramp and spasm: Secondary | ICD-10-CM

## 2018-08-04 DIAGNOSIS — M6289 Other specified disorders of muscle: Secondary | ICD-10-CM

## 2018-08-04 LAB — HM DIABETES EYE EXAM

## 2018-08-04 NOTE — Patient Instructions (Signed)
° ° ° °  If you have lab work done today you will be contacted with your lab results within the next 2 weeks.  If you have not heard from us then please contact us. The fastest way to get your results is to register for My Chart. ° ° °IF you received an x-ray today, you will receive an invoice from [facility]. Please contact [facility] at [phone] with questions or concerns regarding your invoice.  ° °IF you received labwork today, you will receive an invoice from [facility]. Please contact [facility] at [phone] with questions or concerns regarding your invoice.  ° °Our billing staff will not be able to assist you with questions regarding bills from these companies. ° °You will be contacted with the lab results as soon as they are available. The fastest way to get your results is to activate your My Chart account. Instructions are located on the last page of this paperwork. If you have not heard from us regarding the results in 2 weeks, please contact this office. °  ° ° ° °

## 2018-08-04 NOTE — Progress Notes (Signed)
Chief Complaint  Patient presents with  . Transitions Of Care    former weber pt. Pt has concerns about left upper leg pain when he awakes in the mornings-uses ibuprofen for pain and it helps, onset: x 1 week  . medication followup    HPI  Hypertension: Patient here for follow-up of elevated blood pressure. He is exercising and is adherent to low salt diet.  Blood pressure is well controlled at home. Cardiac symptoms none. Patient denies chest pain, chest pressure/discomfort, claudication, dyspnea, exertional chest pressure/discomfort and fatigue.  Cardiovascular risk factors: diabetes mellitus, dyslipidemia, hypertension and male gender. Use of agents associated with hypertension: none. History of target organ damage: none. BP Readings from Last 3 Encounters:  08/04/18 (!) 150/91  02/05/18 128/78  10/30/17 120/70    Diabetes Mellitus: Patient presents for follow up of diabetes. Symptoms: none. Symptoms have stabilized. Patient denies foot ulcerations, hyperglycemia, hypoglycemia , increase appetite, nausea, paresthesia of the feet and polydipsia.  Evaluation to date has been included: hemoglobin A1C.  Home sugars: patient does not check sugars. Treatment to date: Continued metformin which has been effective.   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 Diabetes mellitus without complication (HCC)   . GERD (gastroesophageal reflux disease)   . Hyperlipidemia     Current Outpatient Medications  Medication Sig Dispense Refill  . atorvastatin (LIPITOR) 20 MG tablet Take 1 tablet (20  mg total) by mouth daily at 6 PM. 90 tablet 3  . fish oil-omega-3 fatty acids 1000 MG capsule Take 1 g by mouth daily.    Marland Kitchen lisinopril (PRINIVIL,ZESTRIL) 5 MG tablet Take 1 tablet (5 mg total) by mouth daily. 90 tablet 3  . metFORMIN (GLUCOPHAGE-XR) 500 MG 24 hr tablet Take 2 tablets (1,000 mg total) by mouth 2 (two) times daily. 540 tablet 3   No current facility-administered medications for this visit.     Allergies: No Known Allergies  Past Surgical History:  Procedure Laterality Date  . EYE SURGERY      Social History   Socioeconomic History  . Marital status: Married    Spouse name: Aaron Ferguson  . Number of children: Not on file  . Years of education: Not on file  . Highest education level: Not on file  Occupational History  . Occupation: Careers information officer: [employer]  Social Needs  . Financial resource strain: Not on file  . Food insecurity:    Worry: Not on file    Inability: Not on file  . Transportation needs:    Medical: Not on file    Non-medical: Not on file  Tobacco Use  . Smoking status: Former Smoker    Packs/day: 0.50    Types: Cigarettes    Last attempt to quit: 10/06/1978    Years since quitting: 39.8  . Smokeless tobacco: Never Used  Substance and Sexual Activity  . Alcohol use: No  . Drug use: No  . Sexual activity: Yes    Partners: Female  Lifestyle  .  Physical activity:    Days per week: Not on file    Minutes per session: Not on file  . Stress: Not on file  Relationships  . Social connections:    Talks on phone: Not on file    Gets together: Not on file    Attends religious service: Not on file    Active member of club or organization: Not on file    Attends meetings of clubs or organizations: Not on file    Relationship status: Not on file  Other Topics Concern  . Not on file  Social History Narrative   Lives with wife      Bowls for fun    Family History  Problem Relation Age of Onset  . Diabetes Mother   . Mental illness  Mother   . Diabetes Sister      ROS Review of Systems See HPI Constitution: No fevers or chills No malaise No diaphoresis Skin: No rash or itching Eyes: no blurry vision, no double vision GU: no dysuria or hematuria Neuro: no dizziness or headaches all others reviewed and negative   Objective: Vitals:   08/04/18 1136  BP: (!) 150/91  Pulse: 63  Resp: 16  Temp: (!) 97.4 F (36.3 C)  TempSrc: Oral  SpO2: 95%  Weight: 226 lb 3.2 oz (102.6 kg)  Height: 5' 11.26" (1.81 m)    Physical Exam Physical Exam  Constitutional: She is oriented to person, place, and time. She appears well-developed and well-nourished.  HENT:  Head: Normocephalic and atraumatic.  Eyes: Conjunctivae and EOM are normal.  Cardiovascular: Normal rate, regular rhythm and normal heart sounds.   Pulmonary/Chest: Effort normal and breath sounds normal. No respiratory distress. She has no wheezes.  Abdominal: Normal appearance and bowel sounds are normal. There is no tenderness. There is no CVA tenderness.  Neurological: She is alert and oriented to person, place, and time.   Musculoskeletal Tenderness along the IT band on the left side No knee pain No hip pain Normal range of motion of the back Hamstring tight on the left hamstring with range of motion   Assessment and Plan Aaron Ferguson was seen today for transitions of care and medication followup.  Diagnoses and all orders for this visit:  Muscle cramp- will rule out electrolyte disorder -     Comprehensive metabolic panel  Type 2 diabetes mellitus without complication, without long-term current use of insulin (HCC)- improved with metformin, ADA diet and exercise -     Comprehensive metabolic panel -     Hemoglobin A1c -     Lipid panel  Iliotibial band syndrome of left side- follow up with PT -     Ambulatory referral to Physical Therapy  Hamstring tightness of left lower extremity- recommend aspercreme or biofreeze -     Ambulatory referral  to Physical Therapy  Other orders -     Flu Vaccine QUAD 36+ mos IM     Aaron Ferguson Aaron Ferguson Aaron Ferguson

## 2018-08-05 DIAGNOSIS — M25562 Pain in left knee: Secondary | ICD-10-CM | POA: Diagnosis not present

## 2018-08-05 DIAGNOSIS — M25552 Pain in left hip: Secondary | ICD-10-CM | POA: Diagnosis not present

## 2018-08-05 DIAGNOSIS — M6281 Muscle weakness (generalized): Secondary | ICD-10-CM | POA: Diagnosis not present

## 2018-08-05 LAB — COMPREHENSIVE METABOLIC PANEL
ALT: 32 IU/L (ref 0–44)
AST: 37 IU/L (ref 0–40)
Albumin/Globulin Ratio: 1.7 (ref 1.2–2.2)
Albumin: 4.7 g/dL (ref 3.5–5.5)
Alkaline Phosphatase: 48 IU/L (ref 39–117)
BILIRUBIN TOTAL: 0.6 mg/dL (ref 0.0–1.2)
BUN/Creatinine Ratio: 9 (ref 9–20)
BUN: 10 mg/dL (ref 6–24)
CHLORIDE: 103 mmol/L (ref 96–106)
CO2: 21 mmol/L (ref 20–29)
CREATININE: 1.08 mg/dL (ref 0.76–1.27)
Calcium: 9.5 mg/dL (ref 8.7–10.2)
GFR calc non Af Amer: 75 mL/min/{1.73_m2} (ref >59)
GFR, EST AFRICAN AMERICAN: 86 mL/min/{1.73_m2} (ref >59)
Globulin, Total: 2.7 g/dL (ref 1.5–4.5)
Glucose: 78 mg/dL (ref 65–99)
Potassium: 4.5 mmol/L (ref 3.5–5.2)
Sodium: 139 mmol/L (ref 134–144)
TOTAL PROTEIN: 7.4 g/dL (ref 6.0–8.5)

## 2018-08-05 LAB — HEMOGLOBIN A1C
Est. average glucose Bld gHb Est-mCnc: 123 mg/dL
HEMOGLOBIN A1C: 5.9 % — AB (ref 4.8–5.6)

## 2018-08-05 LAB — LIPID PANEL
CHOLESTEROL TOTAL: 155 mg/dL (ref 100–199)
Chol/HDL Ratio: 2.7 ratio (ref 0.0–5.0)
HDL: 57 mg/dL (ref >39)
LDL Calculated: 88 mg/dL (ref 0–99)
TRIGLYCERIDES: 48 mg/dL (ref 0–149)
VLDL Cholesterol Cal: 10 mg/dL (ref 5–40)

## 2018-08-10 DIAGNOSIS — M25552 Pain in left hip: Secondary | ICD-10-CM | POA: Diagnosis not present

## 2018-08-10 DIAGNOSIS — M6281 Muscle weakness (generalized): Secondary | ICD-10-CM | POA: Diagnosis not present

## 2018-08-10 DIAGNOSIS — M25562 Pain in left knee: Secondary | ICD-10-CM | POA: Diagnosis not present

## 2018-08-12 DIAGNOSIS — M25552 Pain in left hip: Secondary | ICD-10-CM | POA: Diagnosis not present

## 2018-08-12 DIAGNOSIS — M6281 Muscle weakness (generalized): Secondary | ICD-10-CM | POA: Diagnosis not present

## 2018-08-12 DIAGNOSIS — M25562 Pain in left knee: Secondary | ICD-10-CM | POA: Diagnosis not present

## 2018-08-17 DIAGNOSIS — M25562 Pain in left knee: Secondary | ICD-10-CM | POA: Diagnosis not present

## 2018-08-17 DIAGNOSIS — M25552 Pain in left hip: Secondary | ICD-10-CM | POA: Diagnosis not present

## 2018-08-17 DIAGNOSIS — M6281 Muscle weakness (generalized): Secondary | ICD-10-CM | POA: Diagnosis not present

## 2018-08-19 DIAGNOSIS — M25552 Pain in left hip: Secondary | ICD-10-CM | POA: Diagnosis not present

## 2018-08-19 DIAGNOSIS — M6281 Muscle weakness (generalized): Secondary | ICD-10-CM | POA: Diagnosis not present

## 2018-08-19 DIAGNOSIS — M25562 Pain in left knee: Secondary | ICD-10-CM | POA: Diagnosis not present

## 2018-08-24 DIAGNOSIS — M25562 Pain in left knee: Secondary | ICD-10-CM | POA: Diagnosis not present

## 2018-08-24 DIAGNOSIS — M6281 Muscle weakness (generalized): Secondary | ICD-10-CM | POA: Diagnosis not present

## 2018-08-24 DIAGNOSIS — M25552 Pain in left hip: Secondary | ICD-10-CM | POA: Diagnosis not present

## 2018-08-26 DIAGNOSIS — M25562 Pain in left knee: Secondary | ICD-10-CM | POA: Diagnosis not present

## 2018-08-26 DIAGNOSIS — M6281 Muscle weakness (generalized): Secondary | ICD-10-CM | POA: Diagnosis not present

## 2018-08-26 DIAGNOSIS — M25552 Pain in left hip: Secondary | ICD-10-CM | POA: Diagnosis not present

## 2018-08-31 ENCOUNTER — Telehealth: Payer: Self-pay | Admitting: Family Medicine

## 2018-08-31 DIAGNOSIS — M25562 Pain in left knee: Secondary | ICD-10-CM | POA: Diagnosis not present

## 2018-08-31 DIAGNOSIS — M6281 Muscle weakness (generalized): Secondary | ICD-10-CM | POA: Diagnosis not present

## 2018-08-31 DIAGNOSIS — M25552 Pain in left hip: Secondary | ICD-10-CM | POA: Diagnosis not present

## 2018-08-31 NOTE — Telephone Encounter (Signed)
Called Marisue IvanLiz and she will resend form from 08/05/18 to my attn and will have stallings sign and refax on tomorrow.  Marisue IvanLiz agreeable and will refax to me. Dgaddy,CMA

## 2018-08-31 NOTE — Telephone Encounter (Signed)
Copied from CRM [id number]. Topic: General - Other >> Aug 31, 2018 11:41 AM Lynne LoganHudson, Caryn D wrote: Reason for CRM: Marisue IvanLiz with [facility] called and stated they have not received the initial evaluation from Dr. Creta LevinStallings. This was sent on 08/05/18. It needs to be completed, signed and returned to Fax [phone] as soon as possible. Please advise

## 2018-09-07 DIAGNOSIS — M25552 Pain in left hip: Secondary | ICD-10-CM | POA: Diagnosis not present

## 2018-09-07 DIAGNOSIS — M25562 Pain in left knee: Secondary | ICD-10-CM | POA: Diagnosis not present

## 2018-09-07 DIAGNOSIS — M6281 Muscle weakness (generalized): Secondary | ICD-10-CM | POA: Diagnosis not present

## 2018-09-09 DIAGNOSIS — M6281 Muscle weakness (generalized): Secondary | ICD-10-CM | POA: Diagnosis not present

## 2018-09-09 DIAGNOSIS — M25562 Pain in left knee: Secondary | ICD-10-CM | POA: Diagnosis not present

## 2018-09-09 DIAGNOSIS — M25552 Pain in left hip: Secondary | ICD-10-CM | POA: Diagnosis not present

## 2018-11-30 ENCOUNTER — Telehealth: Payer: Self-pay | Admitting: Family Medicine

## 2018-11-30 NOTE — Telephone Encounter (Signed)
Called and spoke with pt regarding their appt with Dr. Creta Levin on 4/29. Due to Dr. Creta Levin being out of the office, I was able to reschedule pt for 3/31 with Dr. Creta Levin. I advised of time and late policy. Thank you!

## 2018-12-28 ENCOUNTER — Other Ambulatory Visit: Payer: Self-pay | Admitting: *Deleted

## 2018-12-28 DIAGNOSIS — E7800 Pure hypercholesterolemia, unspecified: Secondary | ICD-10-CM

## 2018-12-28 DIAGNOSIS — E119 Type 2 diabetes mellitus without complications: Secondary | ICD-10-CM

## 2018-12-28 DIAGNOSIS — Z1329 Encounter for screening for other suspected endocrine disorder: Secondary | ICD-10-CM

## 2019-01-04 ENCOUNTER — Other Ambulatory Visit: Payer: Self-pay

## 2019-01-04 ENCOUNTER — Encounter: Payer: Self-pay | Admitting: Family Medicine

## 2019-01-04 ENCOUNTER — Encounter: Payer: 59 | Admitting: Family Medicine

## 2019-01-04 ENCOUNTER — Telehealth (INDEPENDENT_AMBULATORY_CARE_PROVIDER_SITE_OTHER): Payer: 59 | Admitting: Family Medicine

## 2019-01-04 DIAGNOSIS — R238 Other skin changes: Secondary | ICD-10-CM | POA: Diagnosis not present

## 2019-01-04 DIAGNOSIS — E7800 Pure hypercholesterolemia, unspecified: Secondary | ICD-10-CM | POA: Diagnosis not present

## 2019-01-04 DIAGNOSIS — E11620 Type 2 diabetes mellitus with diabetic dermatitis: Secondary | ICD-10-CM

## 2019-01-04 MED ORDER — CLOTRIMAZOLE 1 % EX CREA: 1.0000 "application " | TOPICAL_CREAM | Freq: Two times a day (BID) | CUTANEOUS | 1 refills | Status: DC

## 2019-01-04 NOTE — Progress Notes (Signed)
Telemedicine Encounter- SOAP NOTE Established Patient  This telephone encounter was conducted with the patient's (or proxy's) verbal consent via audio telecommunications: yes/no: Yes Patient was instructed to have this encounter in a suitably private space; and to only have persons present to whom they give permission to participate. In addition, patient identity was confirmed by use of name plus two identifiers (DOB and address).  I discussed the limitations, risks, security and privacy concerns of performing an evaluation and management service by telephone and the availability of in person appointments. I also discussed with the patient that there may be a patient responsible charge related to this service. The patient expressed understanding and agreed to proceed.  I spent a total of TIME; 0 MIN TO 60 MIN: 20 minutes talking with the patient or their proxy.  CC: diabetes check up  Subjective   Aaron Ferguson is a 58 y.o. established patient. Telephone visit today for  HPI  Diabetes Mellitus: Patient presents for follow up of diabetes. Symptoms: none. Symptoms have stabilized. Patient denies foot ulcerations, hyperglycemia, hypoglycemia , increase appetite, nausea, polydipsia and polyuria.  Evaluation to date has been included: hemoglobin A1C.  Home sugars: patient does not check sugars. Treatment to date: Continued metformin which has been effective.  Lab Results  Component Value Date   HGBA1C 5.9 (H) 08/04/2018   Depression screen [facility] 2/9 01/04/2019 08/04/2018 02/05/2018 10/30/2017 07/20/2017  Decreased Interest 0 0 0 0 0  Down, Depressed, Hopeless 0 0 0 0 0  PHQ - 2 Score 0 0 0 0 0   Skin irritation He was having skin irritation under her arm pit  He states that he changed the deodorant for both axillae and it has been helping to clear it up The skin is itchy and irritated   Dyslipidemia: Patient presents for evaluation of lipids.  Compliance with treatment thus far has been  excellent.  A repeat fasting lipid profile was done.  The patient does not use medications that may worsen dyslipidemias (corticosteroids, progestins, anabolic steroids, diuretics, beta-blockers, amiodarone, cyclosporine, olanzapine).   The patient is not known to have coexisting coronary artery disease.   Lab Results  Component Value Date   CHOL 155 08/04/2018   CHOL 137 02/05/2018   CHOL 156 07/20/2017   Lab Results  Component Value Date   HDL 57 08/04/2018   HDL 53 02/05/2018   HDL 55 07/20/2017   Lab Results  Component Value Date   LDLCALC 88 08/04/2018   LDLCALC 75 02/05/2018   LDLCALC 89 07/20/2017   Lab Results  Component Value Date   TRIG 48 08/04/2018   TRIG 43 02/05/2018   TRIG 58 07/20/2017   Lab Results  Component Value Date   CHOLHDL 2.7 08/04/2018   CHOLHDL 2.6 02/05/2018   CHOLHDL 2.8 07/20/2017   No results found for: LDLDIRECT    Patient Active Problem List   Diagnosis Date Noted  . DM (diabetes mellitus) (HCC) 01/10/2013  . Hyperlipidemia 01/09/2013  . BMI 34.0-34.9,adult 01/09/2013  . GERD (gastroesophageal reflux disease) 01/09/2013    Past Medical History:  Diagnosis Date  . Diabetes mellitus without complication (HCC)   . GERD (gastroesophageal reflux disease)   . Hyperlipidemia     Current Outpatient Medications  Medication Sig Dispense Refill  . atorvastatin (LIPITOR) 20 MG tablet Take 1 tablet (20 mg total) by mouth daily at 6 PM. 90 tablet 3  . fish oil-omega-3 fatty acids 1000 MG capsule Take 1 g by mouth  daily.    . lisinopril (PRINIVIL,ZESTRIL) 5 MG tablet Take 1 tablet (5 mg total) by mouth daily. 90 tablet 3  . metFORMIN (GLUCOPHAGE-XR) 500 MG 24 hr tablet Take 2 tablets (1,000 mg total) by mouth 2 (two) times daily. 540 tablet 3  . clotrimazole (LOTRIMIN) 1 % cream Apply 1 application topically 2 (two) times daily. 30 g 1   No current facility-administered medications for this visit.     No Known Allergies  Social  History   Socioeconomic History  . Marital status: Married    Spouse name: Claris Che  . Number of children: Not on file  . Years of education: Not on file  . Highest education level: Not on file  Occupational History  . Occupation: Careers information officer: [employer]  Social Needs  . Financial resource strain: Not on file  . Food insecurity:    Worry: Not on file    Inability: Not on file  . Transportation needs:    Medical: Not on file    Non-medical: Not on file  Tobacco Use  . Smoking status: Former Smoker    Packs/day: 0.50    Types: Cigarettes    Last attempt to quit: 10/06/1978    Years since quitting: 40.2  . Smokeless tobacco: Never Used  Substance and Sexual Activity  . Alcohol use: No  . Drug use: No  . Sexual activity: Yes    Partners: Female  Lifestyle  . Physical activity:    Days per week: Not on file    Minutes per session: Not on file  . Stress: Not on file  Relationships  . Social connections:    Talks on phone: Not on file    Gets together: Not on file    Attends religious service: Not on file    Active member of club or organization: Not on file    Attends meetings of clubs or organizations: Not on file    Relationship status: Not on file  . Intimate partner violence:    Fear of current or ex partner: Not on file    Emotionally abused: Not on file    Physically abused: Not on file    Forced sexual activity: Not on file  Other Topics Concern  . Not on file  Social History Narrative   Lives with wife      Bowls for fun    ROS Review of Systems  Constitutional: Negative for activity change, appetite change, chills and fever.  HENT: Negative for congestion, nosebleeds, trouble swallowing and voice change.   Respiratory: Negative for cough, shortness of breath and wheezing.   Gastrointestinal: Negative for diarrhea, nausea and vomiting.  Genitourinary: Negative for difficulty urinating, dysuria, flank pain and hematuria.  Musculoskeletal:  Negative for back pain, joint swelling and neck pain.  Neurological: Negative for dizziness, speech difficulty, light-headedness and numbness.  See HPI. All other review of systems negative.   Objective   Vitals as reported by the patient: There were no vitals filed for this visit.  Diagnoses and all orders for this visit:  Pure hypercholesterolemia- stable, will reassess  Controlled type 2 diabetes mellitus with diabetic dermatitis, without long-term current use of insulin (HCC) -   Discussed dermatitis -  Continue metformin as instructed Diabetes stable well controlled hemoglobin a1c is at goal Continue exercise Lipids monitored and renal function in range On metformin On ace  Reviewed diabetic foot care Emphasized importance of eye and dental exam    Skin irritation- complicated  by diabetes -     clotrimazole (LOTRIMIN) 1 % cream; Apply 1 application topically 2 (two) times daily.     I discussed the assessment and treatment plan with the patient. The patient was provided an opportunity to ask questions and all were answered. The patient agreed with the plan and demonstrated an understanding of the instructions.   The patient was advised to call back or seek an in-person evaluation if the symptoms worsen or if the condition fails to improve as anticipated.  I provided 20 minutes of non-face-to-face time during this encounter.  Doristine Bosworth, MD  Primary [facility] at [facility]

## 2019-01-04 NOTE — Patient Instructions (Addendum)
   If you have lab work done today you will be contacted with your lab results within the next 2 weeks.  If you have not heard from us then please contact us. The fastest way to get your results is to register for My Chart.   IF you received an x-ray today, you will receive an invoice from [facility]. Please contact [facility] at [phone] with questions or concerns regarding your invoice.   IF you received labwork today, you will receive an invoice from [facility]. Please contact [facility] at [phone] with questions or concerns regarding your invoice.   Our billing staff will not be able to assist you with questions regarding bills from these companies.  You will be contacted with the lab results as soon as they are available. The fastest way to get your results is to activate your My Chart account. Instructions are located on the last page of this paperwork. If you have not heard from us regarding the results in 2 weeks, please contact this office.     Intertrigo Intertrigo is skin irritation or inflammation (dermatitis) that occurs when folds of skin rub together. The irritation can cause a rash and make skin raw and itchy. This condition most commonly occurs in the skin folds of these areas:  Toes.  Armpits.  Groin.  Under the belly.  Under the breasts.  Buttocks. Intertrigo is not passed from person to person (is not contagious). What are the causes? This condition is caused by heat, moisture, rubbing (friction), and not enough air circulation. The condition can be made worse by:  Sweat.  Bacteria.  A fungus, such as yeast. What increases the risk? This condition is more likely to occur if you have moisture in your skin folds. You are more likely to develop this condition if you:  Have diabetes.  Are overweight.  Are not able to move around or are not active.  Live in a warm and moist climate.  Wear splints, braces, or other medical  devices.  Are not able to control your bowels or bladder (have incontinence). What are the signs or symptoms? Symptoms of this condition include:  A pink or red skin rash in the skin fold or near the skin fold.  Raw or scaly skin.  Itchiness.  A burning feeling.  Bleeding.  Leaking fluid.  A bad smell. How is this diagnosed? This condition is diagnosed with a medical history and physical exam. You may also have a skin swab to test for bacteria or a fungus. How is this treated? This condition may be treated by:  Cleaning and drying your skin.  Taking an antibiotic medicine or using an antibiotic skin cream for a bacterial infection.  Using an antifungal cream on your skin or taking pills for an infection that was caused by a fungus, such as yeast.  Using a steroid ointment to relieve itchiness and irritation.  Separating the skin fold with a clean cotton cloth to absorb moisture and allow air to flow into the area. Follow these instructions at home:  Keep the affected area clean and dry.  Do not scratch your skin.  Stay in a cool environment as much as possible. Use an air conditioner or fan, if available.  Apply over-the-counter and prescription medicines only as told by your health care provider.  If you were prescribed an antibiotic medicine, use it as told by your health care provider. Do not stop using the antibiotic even if your condition improves.    Keep all follow-up visits as told by your health care provider. This is important. How is this prevented?   Maintain a healthy weight.  Take care of your feet, especially if you have diabetes. Foot care includes: ? Wearing shoes that fit well. ? Keeping your feet dry. ? Wearing clean, breathable socks.  Protect the skin around your groin and buttocks, especially if you have incontinence. Skin protection includes: ? Following a regular cleaning routine. ? Using skin protectant creams, powders, or  ointments. ? Changing protection pads frequently.  Do not wear tight clothes. Wear clothes that are loose, absorbent, and made of cotton.  Wear a bra that gives good support, if needed.  Shower and dry yourself well after activity or exercise. Use a hair dryer on a cool setting to dry between skin folds, especially after you bathe.  If you have diabetes, keep your blood sugar under control. Contact a health care provider if:  Your symptoms do not improve with treatment.  Your symptoms get worse or they spread.  You notice increased redness and warmth.  You have a fever. Summary  Intertrigo is skin irritation or inflammation (dermatitis) that occurs when folds of skin rub together.  This condition is caused by heat, moisture, rubbing (friction), and not enough air circulation.  This condition may be treated by cleaning and drying your skin and with medicines.  Apply over-the-counter and prescription medicines only as told by your health care provider.  Keep all follow-up visits as told by your health care provider. This is important. This information is not intended to replace advice given to you by your health care provider. Make sure you discuss any questions you have with your health care provider. Document Released: 09/22/2005 Document Revised: 02/22/2018 Document Reviewed: 02/22/2018 Elsevier Interactive Patient Education  2019 Elsevier Inc.  

## 2019-01-04 NOTE — Progress Notes (Signed)
CC:  Per pt he was originally scheduled for a annual exam but is unsure of appt reason now.  Pt has diabetes.   No other concerns. No travel outside the country or [place] in the past 3 weeks.

## 2019-01-21 ENCOUNTER — Other Ambulatory Visit: Payer: Self-pay

## 2019-01-21 ENCOUNTER — Ambulatory Visit (INDEPENDENT_AMBULATORY_CARE_PROVIDER_SITE_OTHER): Payer: 59 | Admitting: Family Medicine

## 2019-01-21 DIAGNOSIS — E7800 Pure hypercholesterolemia, unspecified: Secondary | ICD-10-CM | POA: Diagnosis not present

## 2019-01-21 DIAGNOSIS — E11620 Type 2 diabetes mellitus with diabetic dermatitis: Secondary | ICD-10-CM

## 2019-01-21 DIAGNOSIS — Z1329 Encounter for screening for other suspected endocrine disorder: Secondary | ICD-10-CM | POA: Diagnosis not present

## 2019-01-21 DIAGNOSIS — E119 Type 2 diabetes mellitus without complications: Secondary | ICD-10-CM

## 2019-01-22 LAB — CMP14+EGFR
ALT: 49 IU/L — ABNORMAL HIGH (ref 0–44)
AST: 49 IU/L — ABNORMAL HIGH (ref 0–40)
Albumin/Globulin Ratio: 2 (ref 1.2–2.2)
Albumin: 4.5 g/dL (ref 3.8–4.9)
Alkaline Phosphatase: 47 IU/L (ref 39–117)
BUN/Creatinine Ratio: 7 — ABNORMAL LOW (ref 9–20)
BUN: 9 mg/dL (ref 6–24)
Bilirubin Total: 0.5 mg/dL (ref 0.0–1.2)
CO2: 21 mmol/L (ref 20–29)
Calcium: 9.8 mg/dL (ref 8.7–10.2)
Chloride: 103 mmol/L (ref 96–106)
Creatinine, Ser: 1.25 mg/dL (ref 0.76–1.27)
GFR calc Af Amer: 72 mL/min/{1.73_m2} (ref >59)
GFR calc non Af Amer: 63 mL/min/{1.73_m2} (ref >59)
Globulin, Total: 2.3 g/dL (ref 1.5–4.5)
Glucose: 123 mg/dL — ABNORMAL HIGH (ref 65–99)
Potassium: 5.2 mmol/L (ref 3.5–5.2)
Sodium: 141 mmol/L (ref 134–144)
Total Protein: 6.8 g/dL (ref 6.0–8.5)

## 2019-01-22 LAB — LIPID PANEL
Chol/HDL Ratio: 2.9 ratio (ref 0.0–5.0)
Cholesterol, Total: 146 mg/dL (ref 100–199)
HDL: 50 mg/dL (ref >39)
LDL Calculated: 86 mg/dL (ref 0–99)
Triglycerides: 51 mg/dL (ref 0–149)
VLDL Cholesterol Cal: 10 mg/dL (ref 5–40)

## 2019-01-22 LAB — HEMOGLOBIN A1C
Est. average glucose Bld gHb Est-mCnc: 140 mg/dL
Hgb A1c MFr Bld: 6.5 % — ABNORMAL HIGH (ref 4.8–5.6)

## 2019-01-22 LAB — TSH: TSH: 2.22 u[IU]/mL (ref 0.450–4.500)

## 2019-01-26 ENCOUNTER — Ambulatory Visit: Payer: 59 | Admitting: Family Medicine

## 2019-01-26 ENCOUNTER — Other Ambulatory Visit: Payer: Self-pay

## 2019-01-26 ENCOUNTER — Encounter: Payer: Self-pay | Admitting: Family Medicine

## 2019-01-26 VITALS — BP 126/70 | HR 83 | Temp 98.0°F | Resp 16 | Ht 71.0 in | Wt 238.0 lb

## 2019-01-26 DIAGNOSIS — B354 Tinea corporis: Secondary | ICD-10-CM

## 2019-01-26 DIAGNOSIS — B360 Pityriasis versicolor: Secondary | ICD-10-CM | POA: Diagnosis not present

## 2019-01-26 MED ORDER — KETOCONAZOLE 2 % EX CREA: 1.0000 "application " | TOPICAL_CREAM | Freq: Every day | CUTANEOUS | 0 refills | Status: DC

## 2019-01-26 MED ORDER — SELENIUM SULFIDE 2.3 % EX SHAM: 1.0000 "application " | MEDICATED_SHAMPOO | Freq: Every day | CUTANEOUS | 0 refills | Status: AC

## 2019-01-26 NOTE — Progress Notes (Signed)
Acute Office Visit  Subjective:    Patient ID: Aaron Ferguson, male    DOB: September 08, 1961, 58 y.o.   MRN: [id number]  Chief Complaint  Patient presents with  . Rash    pt states he has been having a rash under arms x 3 weeks     HPI Patient is in today for rash noted under the arms for two months. Pt states worse over last few weeks.  Pt states he used lotrimin with no improvement. 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 Pt states he is often hot since pts want bus to be warmer. He also helps with getting pts safely to appts. pts wife concerned he is allergic to peanut butter/peanuts with outbreak. Pt states itchy.   Glucose well controlled-A1c 6.5%  Past Medical History:  Diagnosis Date  . Diabetes mellitus without complication (HCC)   . GERD (gastroesophageal reflux disease)   . Hyperlipidemia     Past Surgical History:  Procedure Laterality Date  . EYE SURGERY      Family History  Problem Relation Age of Onset  . Diabetes Mother   . Mental illness Mother   . Diabetes Sister     Social History   Socioeconomic History  . Marital status: Married    Spouse name: Aaron Ferguson  . Number of children: Not on file  . Years of education: Not on file  . Highest education level: Not on file  Occupational History  . Occupation: Careers information officer: [employer]  Social Needs  . Financial resource strain: Not on file  . Food insecurity:    Worry: Not on file    Inability: Not on file  . Transportation needs:    Medical: Not on file    Non-medical: Not on file  Tobacco Use  . Smoking status: Former Smoker    Packs/day: 0.50    Types: Cigarettes    Last attempt to quit: 10/06/1978    Years since quitting: 40.3  . Smokeless tobacco: Never Used  Substance and Sexual Activity  . Alcohol use: No  . Drug use: No  . Sexual activity: Yes    Partners: Female  Lifestyle  . Physical activity:    Days per week: Not on file    Minutes per session: Not on file  .  Stress: Not on file  Relationships  . Social connections:    Talks on phone: Not on file    Gets together: Not on file    Attends religious service: Not on file    Active member of club or organization: Not on file    Attends meetings of clubs or organizations: Not on file    Relationship status: Not on file  . Intimate partner violence:    Fear of current or ex partner: Not on file    Emotionally abused: Not on file    Physically abused: Not on file    Forced sexual activity: Not on file  Other Topics Concern  . Not on file  Social History Narrative   Lives with wife      Bowls for fun    Outpatient Medications Prior to Visit  Medication Sig Dispense Refill  . atorvastatin (LIPITOR) 20 MG tablet Take 1 tablet (20 mg total) by mouth daily at 6 PM. 90 tablet 3  . clotrimazole (LOTRIMIN) 1 % cream Apply 1 application topically 2 (two) times daily. 30 g 1  . fish oil-omega-3 fatty acids 1000 MG capsule  Take 1 g by mouth daily.    Marland Kitchen. lisinopril (PRINIVIL,ZESTRIL) 5 MG tablet Take 1 tablet (5 mg total) by mouth daily. 90 tablet 3  . metFORMIN (GLUCOPHAGE-XR) 500 MG 24 hr tablet Take 2 tablets (1,000 mg total) by mouth 2 (two) times daily. 540 tablet 3   No facility-administered medications prior to visit.     No Known Allergies  ROS CONSTITUTIONAL: no fever INTEG: persistent rash, itching        Objective:    Physical Exam  Constitutional: He appears well-developed and well-nourished. No distress.  Skin: Rash noted.  hyperpigmentation plaques axillary bilat, hyperpigmented macules chest, back, no papules, no vesicles  BP 126/70   Pulse 83   Temp 98 F (36.7 C) (Oral)   Resp 16   Ht 5\' 11"  (1.803 m)   Wt 238 lb (108 kg)   SpO2 97%   BMI 33.19 kg/m  Wt Readings from Last 3 Encounters:  01/26/19 238 lb (108 kg)  08/04/18 226 lb 3.2 oz (102.6 kg)  02/05/18 228 lb (103.4 kg)    Health Maintenance Due  Topic Date Due  . FOOT EXAM  07/20/2018      Lab  Results  Component Value Date   TSH 2.220 01/21/2019   Lab Results  Component Value Date   WBC 5.9 05/22/2015   HGB 13.4 05/22/2015   HCT 39.5 05/22/2015   MCV 81.6 05/22/2015   PLT 206 05/22/2015   Lab Results  Component Value Date   NA 141 01/21/2019   K 5.2 01/21/2019   CO2 21 01/21/2019   GLUCOSE 123 (H) 01/21/2019   BUN 9 01/21/2019   CREATININE 1.25 01/21/2019   BILITOT 0.5 01/21/2019   ALKPHOS 47 01/21/2019   AST 49 (H) 01/21/2019   ALT 49 (H) 01/21/2019   PROT 6.8 01/21/2019   ALBUMIN 4.5 01/21/2019   CALCIUM 9.8 01/21/2019   Lab Results  Component Value Date   CHOL 146 01/21/2019   Lab Results  Component Value Date   HDL 50 01/21/2019   Lab Results  Component Value Date   LDLCALC 86 01/21/2019   Lab Results  Component Value Date   TRIG 51 01/21/2019   Lab Results  Component Value Date   CHOLHDL 2.9 01/21/2019   Lab Results  Component Value Date   HGBA1C 6.5 (H) 01/21/2019       Assessment & Plan:   Problem List Items Addressed This Visit    None    1. Tinea corporis Lotrimin-not working-change to ketoconazole-rx-apply daily x 2 weeks  2. Tinea versicolor Selenium sulfide-rx D/w pt treatment x `1 week-may retreat if returns If no improvement notify for derm referral   LISA Mat CarneLEIGH CORUM, MD

## 2019-01-26 NOTE — Patient Instructions (Addendum)
     If you have lab work done today you will be contacted with your lab results within the next 2 weeks.  If you have not heard from [place] then please contact us. The fastest way to get your results is to register for My Chart.   IF you received an x-ray today, you will receive an invoice from [facility]. Please contact [facility] at [phone] with questions or concerns regarding your invoice.  If noimprovement using medication for 2 weeks-notify for derm referral IF you received labwork today, you will receive an invoice from [place]. Please contact [facility] at [phone] with questions or concerns regarding your invoice.   Our billing staff will not be able to assist you with questions regarding bills from these companies.  You will be contacted with the lab results as soon as they are available. The fastest way to get your results is to activate your My Chart account. Instructions are located on the last page of this paperwork. If you have not heard from [place] regarding the results in 2 weeks, please contact this office.

## 2019-02-01 ENCOUNTER — Telehealth: Payer: Self-pay | Admitting: Family Medicine

## 2019-02-01 NOTE — Telephone Encounter (Signed)
Spoke with pt and advised him of doctor recommendations, he verbalized understanding. Informed pt to stop shampoo and just continue to use the cream at this time. She also wanted him so take some benadryl for the itching and whelps.

## 2019-02-01 NOTE — Telephone Encounter (Signed)
Copied from CRM [id number]. Topic: Quick Communication - Rx Refill/Question >> Feb 01, 2019  8:54 AM Frances Furbish L wrote: Medication: Selenium Sulfide 2.3 % SHAM [[id number]] pt stated that he had a reaction to shampoo. Pt had bumps on arms and had hives and whelps. Pt has stopped medication. Pt would like a call back regarding. Please advise

## 2019-02-02 ENCOUNTER — Encounter: Payer: 59 | Admitting: Family Medicine

## 2019-02-03 ENCOUNTER — Other Ambulatory Visit: Payer: Self-pay | Admitting: Family Medicine

## 2019-02-06 ENCOUNTER — Other Ambulatory Visit: Payer: Self-pay | Admitting: Family Medicine

## 2019-02-06 DIAGNOSIS — E7800 Pure hypercholesterolemia, unspecified: Secondary | ICD-10-CM

## 2019-02-06 DIAGNOSIS — E119 Type 2 diabetes mellitus without complications: Secondary | ICD-10-CM

## 2019-02-06 MED ORDER — ATORVASTATIN CALCIUM 20 MG PO TABS: 20.0000 mg | ORAL_TABLET | Freq: Every day | ORAL | 3 refills | Status: DC

## 2019-02-06 MED ORDER — LISINOPRIL 5 MG PO TABS: 5.0000 mg | ORAL_TABLET | Freq: Every day | ORAL | 1 refills | Status: DC

## 2019-02-06 MED ORDER — METFORMIN HCL ER 500 MG PO TB24: 1000.0000 mg | ORAL_TABLET | Freq: Two times a day (BID) | ORAL | 1 refills | Status: DC

## 2019-04-11 ENCOUNTER — Other Ambulatory Visit: Payer: Self-pay | Admitting: Internal Medicine

## 2019-04-11 ENCOUNTER — Other Ambulatory Visit: Payer: BLUE CROSS/BLUE SHIELD

## 2019-04-11 DIAGNOSIS — Z20822 Contact with and (suspected) exposure to covid-19: Secondary | ICD-10-CM

## 2019-04-16 LAB — NOVEL CORONAVIRUS, NAA: SARS-CoV-2, NAA: NOT DETECTED

## 2019-07-15 ENCOUNTER — Ambulatory Visit: Payer: 59 | Admitting: Family Medicine

## 2019-07-15 ENCOUNTER — Encounter: Payer: Self-pay | Admitting: Family Medicine

## 2019-07-15 ENCOUNTER — Other Ambulatory Visit: Payer: Self-pay

## 2019-07-15 VITALS — BP 143/74 | HR 70 | Temp 97.5°F | Resp 16 | Ht 71.0 in | Wt 232.8 lb

## 2019-07-15 DIAGNOSIS — Z23 Encounter for immunization: Secondary | ICD-10-CM | POA: Diagnosis not present

## 2019-07-15 DIAGNOSIS — E7800 Pure hypercholesterolemia, unspecified: Secondary | ICD-10-CM

## 2019-07-15 DIAGNOSIS — B354 Tinea corporis: Secondary | ICD-10-CM | POA: Diagnosis not present

## 2019-07-15 DIAGNOSIS — Z6834 Body mass index (BMI) 34.0-34.9, adult: Secondary | ICD-10-CM

## 2019-07-15 DIAGNOSIS — B360 Pityriasis versicolor: Secondary | ICD-10-CM | POA: Diagnosis not present

## 2019-07-15 DIAGNOSIS — E119 Type 2 diabetes mellitus without complications: Secondary | ICD-10-CM

## 2019-07-15 DIAGNOSIS — E11628 Type 2 diabetes mellitus with other skin complications: Secondary | ICD-10-CM | POA: Diagnosis not present

## 2019-07-15 LAB — POCT GLYCOSYLATED HEMOGLOBIN (HGB A1C): Hemoglobin A1C: 6.1 % — AB (ref 4.0–5.6)

## 2019-07-15 MED ORDER — LISINOPRIL 5 MG PO TABS: 5.0000 mg | ORAL_TABLET | Freq: Every day | ORAL | 1 refills | Status: DC

## 2019-07-15 MED ORDER — METFORMIN HCL ER 500 MG PO TB24: 1000.0000 mg | ORAL_TABLET | Freq: Two times a day (BID) | ORAL | 1 refills | Status: DC

## 2019-07-15 NOTE — Progress Notes (Signed)
Established Patient Office Visit  Subjective:  Patient ID: Aaron Ferguson, male    DOB: 1961/07/24  Age: 58 y.o. MRN: [id number]  CC:  Chief Complaint  Patient presents with  . Diabetes    follow up on his diabetes;  Marland Kitchen Medication Refill    on all medications    HPI Aaron Ferguson presents for   Diabetes Patient is here to follow up for his well controlled diabetes During the pandemic he has been eating less He is exercising more His meds include metformin XR 1072m bid as well as lipitor, lisinopril His sugars are well controlled  Dyslipidemia: Patient presents for evaluation of lipids.  Compliance with treatment thus far has been excellent.  A repeat fasting lipid profile was done.  The patient does not use medications that may worsen dyslipidemias (corticosteroids, progestins, anabolic steroids, diuretics, beta-blockers, amiodarone, cyclosporine, olanzapine). The patient exercises three times a week.  The patient is not known to have coexisting coronary artery disease.   The 10-year ASCVD risk score (Mikey BussingDC JBrooke Bonito, et al., 2013) is: 16.6%   Values used to calculate the score:     Age: [age]years     Sex: Male     Is Non-Hispanic African American: Yes     Diabetic: Yes     Tobacco smoker: No     Systolic Blood Pressure: 1354mmHg     Is BP treated: No     HDL Cholesterol: 50 mg/dL     Total Cholesterol: 146 mg/dL   Obesity He is walking 3 miles a day four times a week. [place] park in [place]  He continues to work on his cardio and is feeling healthier.  He reports that he is eating better and drinking more water.  Wt Readings from Last 3 Encounters:  07/15/19 232 lb 12.8 oz (105.6 kg)  01/26/19 238 lb (108 kg)  08/04/18 226 lb 3.2 oz (102.6 kg)   Tinea Versicolor Pt saw Dr. CHolly Bodilyand was given ketoconazole shampoo He reports that the shampoo burned but the cream helped with the rash It is still dark under his armpit.  Past Medical History:  Diagnosis  Date  . Diabetes mellitus without complication ([place]   . GERD (gastroesophageal reflux disease)   . Hyperlipidemia     Past Surgical History:  Procedure Laterality Date  . EYE SURGERY      Family History  Problem Relation Age of Onset  . Diabetes Mother   . Mental illness Mother   . Diabetes Sister     Social History   Socioeconomic History  . Marital status: Married    Spouse name: MJoycelyn Schmid . Number of children: Not on file  . Years of education: Not on file  . Highest education level: Not on file  Occupational History  . Occupation: TEstate manager/land agent [employer] . Financial resource strain: Not on file  . Food insecurity    Worry: Not on file    Inability: Not on file  . Transportation needs    Medical: Not on file    Non-medical: Not on file  Tobacco Use  . Smoking status: Former Smoker    Packs/day: 0.50    Types: Cigarettes    Quit date: 10/06/1978    Years since quitting: 40.8  . Smokeless tobacco: Never Used  Substance and Sexual Activity  . Alcohol use: No  . Drug use: No  . Sexual activity: Yes  Partners: Female  Lifestyle  . Physical activity    Days per week: Not on file    Minutes per session: Not on file  . Stress: Not on file  Relationships  . Social Herbalist on phone: Not on file    Gets together: Not on file    Attends religious service: Not on file    Active member of club or organization: Not on file    Attends meetings of clubs or organizations: Not on file    Relationship status: Not on file  . Intimate partner violence    Fear of current or ex partner: Not on file    Emotionally abused: Not on file    Physically abused: Not on file    Forced sexual activity: Not on file  Other Topics Concern  . Not on file  Social History Narrative   Lives with wife      Bowls for fun    Outpatient Medications Prior to Visit  Medication Sig Dispense Refill  . atorvastatin (LIPITOR) 20 MG tablet Take 1  tablet (20 mg total) by mouth daily at 6 PM. 90 tablet 3  . fish oil-omega-3 fatty acids 1000 MG capsule Take 1 g by mouth daily.    Marland Kitchen lisinopril (ZESTRIL) 5 MG tablet Take 1 tablet (5 mg total) by mouth daily. 90 tablet 1  . metFORMIN (GLUCOPHAGE-XR) 500 MG 24 hr tablet Take 2 tablets (1,000 mg total) by mouth 2 (two) times daily. 360 tablet 1  . clotrimazole (LOTRIMIN) 1 % cream Apply 1 application topically 2 (two) times daily. 30 g 1  . ketoconazole (NIZORAL) 2 % cream APPLY UNDER THE ARMS DAILY 60 g 0   No facility-administered medications prior to visit.     No Known Allergies  ROS Review of Systems Review of Systems  Constitutional: Negative for activity change, appetite change, chills and fever.  HENT: Negative for congestion, nosebleeds, trouble swallowing and voice change.   Respiratory: Negative for cough, shortness of breath and wheezing.   Gastrointestinal: Negative for diarrhea, nausea and vomiting.  Genitourinary: Negative for difficulty urinating, dysuria, flank pain and hematuria.  Musculoskeletal: Negative for back pain, joint swelling and neck pain.  Neurological: Negative for dizziness, speech difficulty, light-headedness and numbness.  See HPI. All other review of systems negative.      Objective:    Physical Exam  BP (!) 143/74   Pulse 70   Temp (!) 97.5 F (36.4 C) (Oral)   Resp 16   Ht 5' 11"  (1.803 m)   Wt 232 lb 12.8 oz (105.6 kg)   SpO2 99%   BMI 32.47 kg/m  Wt Readings from Last 3 Encounters:  07/15/19 232 lb 12.8 oz (105.6 kg)  01/26/19 238 lb (108 kg)  08/04/18 226 lb 3.2 oz (102.6 kg)   Physical Exam  Constitutional: Oriented to person, place, and time. Appears well-developed and well-nourished.  HENT:  Head: Normocephalic and atraumatic.  Eyes: Conjunctivae and EOM are normal.  Cardiovascular: Normal rate, regular rhythm, normal heart sounds and intact distal pulses.  No murmur heard. Pulmonary/Chest: Effort normal and breath sounds  normal. No stridor. No respiratory distress. Has no wheezes.  Neurological: Is alert and oriented to person, place, and time.  Skin: Skin is warm. Capillary refill takes less than 2 seconds. Hyperpigmentation of the axillae  bilaterally Psychiatric: Has a normal mood and affect. Behavior is normal. Judgment and thought content normal.    There are no preventive care  reminders to display for this patient.  There are no preventive care reminders to display for this patient.  Lab Results  Component Value Date   TSH 2.220 01/21/2019   Lab Results  Component Value Date   WBC 5.9 05/22/2015   HGB 13.4 05/22/2015   HCT 39.5 05/22/2015   MCV 81.6 05/22/2015   PLT 206 05/22/2015   Lab Results  Component Value Date   NA 139 07/15/2019   K 4.5 07/15/2019   CO2 21 07/15/2019   GLUCOSE 90 07/15/2019   BUN 12 07/15/2019   CREATININE 1.08 07/15/2019   BILITOT 0.8 07/15/2019   ALKPHOS 45 07/15/2019   AST 38 07/15/2019   ALT 29 07/15/2019   PROT 7.0 07/15/2019   ALBUMIN 4.6 07/15/2019   CALCIUM 9.3 07/15/2019   Lab Results  Component Value Date   CHOL 146 01/21/2019   Lab Results  Component Value Date   HDL 50 01/21/2019   Lab Results  Component Value Date   LDLCALC 86 01/21/2019   Lab Results  Component Value Date   TRIG 51 01/21/2019   Lab Results  Component Value Date   CHOLHDL 2.9 01/21/2019   Lab Results  Component Value Date   HGBA1C 6.1 (A) 07/15/2019      Assessment & Plan:   Problem List Items Addressed This Visit      Endocrine   DM (diabetes mellitus) ([place]) - Primary Controlled with few episodes of hyperglycemia Discussed standard of care On lipitor, on ace inhibitor for renal protection Continue exercise   Relevant Medications   metFORMIN (GLUCOPHAGE-XR) 500 MG 24 hr tablet   lisinopril (ZESTRIL) 5 MG tablet   Other Relevant Orders   CMP14+EGFR (Completed)   HM Diabetes Foot Exam (Completed)   POCT glycosylated hemoglobin (Hb A1C) (Completed)      Musculoskeletal and Integument   Tinea corporis - improved   Tinea versicolor  - improved     Other   Hyperlipidemia - Discussed medications that affect lipids Reminded patient to avoid grapefruits Reviewed last 3 lipids Discussed current meds: statin, aspirin Advised dietary fiber and fish oil and ways to keep HDL high CAD prevention and reviewed side effects of statins Patient is a nonsmoker.  Smoking cessation discussed    Relevant Medications   lisinopril (ZESTRIL) 5 MG tablet   Other Relevant Orders   CMP14+EGFR (Completed)   BMI 34.0-34.9,adult  - improved      Meds ordered this encounter  Medications  . metFORMIN (GLUCOPHAGE-XR) 500 MG 24 hr tablet    Sig: Take 2 tablets (1,000 mg total) by mouth 2 (two) times daily.    Dispense:  360 tablet    Refill:  1    Refill when patient is due.  . lisinopril (ZESTRIL) 5 MG tablet    Sig: Take 1 tablet (5 mg total) by mouth daily.    Dispense:  90 tablet    Refill:  1    Refill when patient is due.    Follow-up: Return in about 6 months (around 01/13/2020) for physical exam and diabetes follow up.    Forrest Moron, MD

## 2019-07-15 NOTE — Patient Instructions (Signed)
° ° ° °  If you have lab work done today you will be contacted with your lab results within the next 2 weeks.  If you have not heard from us then please contact us. The fastest way to get your results is to register for My Chart. ° ° °IF you received an x-ray today, you will receive an invoice from [facility]. Please contact [facility] at [phone] with questions or concerns regarding your invoice.  ° °IF you received labwork today, you will receive an invoice from [facility]. Please contact [facility] at [phone] with questions or concerns regarding your invoice.  ° °Our billing staff will not be able to assist you with questions regarding bills from these companies. ° °You will be contacted with the lab results as soon as they are available. The fastest way to get your results is to activate your My Chart account. Instructions are located on the last page of this paperwork. If you have not heard from us regarding the results in 2 weeks, please contact this office. °  ° ° ° °

## 2019-07-16 LAB — CMP14+EGFR
ALT: 29 IU/L (ref 0–44)
AST: 38 IU/L (ref 0–40)
Albumin/Globulin Ratio: 1.9 (ref 1.2–2.2)
Albumin: 4.6 g/dL (ref 3.8–4.9)
Alkaline Phosphatase: 45 IU/L (ref 39–117)
BUN/Creatinine Ratio: 11 (ref 10–24)
BUN: 12 mg/dL (ref 8–27)
Bilirubin Total: 0.8 mg/dL (ref 0.0–1.2)
CO2: 21 mmol/L (ref 20–29)
Calcium: 9.3 mg/dL (ref 8.6–10.2)
Chloride: 104 mmol/L (ref 96–106)
Creatinine, Ser: 1.08 mg/dL (ref 0.76–1.27)
GFR calc Af Amer: 86 mL/min/{1.73_m2} (ref >59)
GFR calc non Af Amer: 74 mL/min/{1.73_m2} (ref >59)
Globulin, Total: 2.4 g/dL (ref 1.5–4.5)
Glucose: 90 mg/dL (ref 65–99)
Potassium: 4.5 mmol/L (ref 3.5–5.2)
Sodium: 139 mmol/L (ref 134–144)
Total Protein: 7 g/dL (ref 6.0–8.5)

## 2019-08-04 ENCOUNTER — Ambulatory Visit: Payer: Self-pay | Admitting: Family Medicine

## 2019-09-09 ENCOUNTER — Other Ambulatory Visit: Payer: Self-pay | Admitting: Family Medicine

## 2019-10-24 ENCOUNTER — Encounter: Payer: Self-pay | Admitting: Gastroenterology

## 2019-11-18 ENCOUNTER — Other Ambulatory Visit: Payer: Self-pay

## 2019-11-18 ENCOUNTER — Ambulatory Visit (AMBULATORY_SURGERY_CENTER): Payer: 59 | Admitting: *Deleted

## 2019-11-18 VITALS — Ht 71.5 in | Wt 230.0 lb

## 2019-11-18 DIAGNOSIS — Z01818 Encounter for other preprocedural examination: Secondary | ICD-10-CM

## 2019-11-18 DIAGNOSIS — Z1211 Encounter for screening for malignant neoplasm of colon: Secondary | ICD-10-CM

## 2019-11-18 MED ORDER — SUPREP BOWEL PREP KIT 17.5-3.13-1.6 GM/177ML PO SOLN: ORAL | 0 refills | Status: DC

## 2019-11-18 NOTE — Progress Notes (Signed)
Pt's previsit is done over the phone and all paperwork (prep instructions, blank consent form to just read over, pre-procedure acknowledgement form and stamped envelope) sent to patient  Pt has no trouble moving neck  Pt is aware that care partner will wait in the car during procedure; if they feel like they will be too hot or cold to wait in the car; they may wait in the 4 th floor lobby. Patient is aware to bring only one care partner. We want them to wear a mask (we do not have any that we can provide them), practice social distancing, and we will check their temperatures when they get here.  I did remind the patient that their care partner needs to stay in the parking lot the entire time and have a cell phone available, we will call them when the pt is ready for discharge. Patient will wear mask into building.  covid test at [facility] 11-29-19 at 800  No egg or soy allergy  No home oxygen use or problems with anesthesia  No medications for weight loss taken  emmi information given  $15 off Suprep coupon mailed.

## 2019-11-29 ENCOUNTER — Encounter: Payer: Self-pay | Admitting: Gastroenterology

## 2019-11-29 ENCOUNTER — Other Ambulatory Visit: Payer: Self-pay

## 2019-11-29 ENCOUNTER — Other Ambulatory Visit (HOSPITAL_COMMUNITY)
Admission: RE | Admit: 2019-11-29 | Discharge: 2019-11-29 | Disposition: A | Discharge status: Home / Self Care | Payer: 59 | Source: Ambulatory Visit | Attending: Gastroenterology | Admitting: Gastroenterology

## 2019-11-29 DIAGNOSIS — Z01812 Encounter for preprocedural laboratory examination: Secondary | ICD-10-CM | POA: Diagnosis not present

## 2019-11-29 DIAGNOSIS — Z20822 Contact with and (suspected) exposure to covid-19: Secondary | ICD-10-CM | POA: Insufficient documentation

## 2019-11-29 LAB — SARS CORONAVIRUS 2 (TAT 6-24 HRS): SARS Coronavirus 2: NEGATIVE

## 2019-12-02 ENCOUNTER — Ambulatory Visit (AMBULATORY_SURGERY_CENTER): Payer: 59 | Admitting: Gastroenterology

## 2019-12-02 ENCOUNTER — Other Ambulatory Visit: Payer: Self-pay

## 2019-12-02 ENCOUNTER — Encounter: Payer: Self-pay | Admitting: Gastroenterology

## 2019-12-02 VITALS — BP 140/81 | HR 65 | Temp 96.2°F | Resp 17 | Ht 71.0 in | Wt 230.0 lb

## 2019-12-02 DIAGNOSIS — Z1211 Encounter for screening for malignant neoplasm of colon: Secondary | ICD-10-CM

## 2019-12-02 MED ORDER — SODIUM CHLORIDE 0.9 % IV SOLN: 500.0000 mL | Freq: Once | INTRAVENOUS | Status: DC

## 2019-12-02 NOTE — Progress Notes (Signed)
Report to PACU, RN, vss, BBS= Clear.  

## 2019-12-02 NOTE — Op Note (Signed)
[facility] Patient Name: Aaron Ferguson Procedure Date: 12/02/2019 8:21 AM MRN: [id number] Endoscopist: Rachael Fee , MD Age: 59 Referring MD:  Date of Birth: 02-10-1961 Gender: Male Account #: [id number] Procedure:                Colonoscopy Indications:              Screening for colorectal malignant neoplasm Medicines:                Monitored Anesthesia Care Procedure:                Pre-Anesthesia Assessment:                           - Prior to the procedure, a History and Physical                            was performed, and patient medications and                            allergies were reviewed. The patient's tolerance of                            previous anesthesia was also reviewed. The risks                            and benefits of the procedure and the sedation                            options and risks were discussed with the patient.                            All questions were answered, and informed consent                            was obtained. Prior Anticoagulants: The patient has                            taken no previous anticoagulant or antiplatelet                            agents. ASA Grade Assessment: II - A patient with                            mild systemic disease. After reviewing the risks                            and benefits, the patient was deemed in                            satisfactory condition to undergo the procedure.                           After obtaining informed consent, the colonoscope  was passed under direct vision. Throughout the                            procedure, the patient's blood pressure, pulse, and                            oxygen saturations were monitored continuously. The                            Colonoscope was introduced through the anus and                            advanced to the the cecum, identified by                            appendiceal orifice and  ileocecal valve. The                            colonoscopy was performed without difficulty. The                            patient tolerated the procedure well. The quality                            of the bowel preparation was adequate. The                            ileocecal valve, appendiceal orifice, and rectum                            were photographed. Scope In: 8:25:00 AM Scope Out: 8:56:14 AM Scope Withdrawal Time: 0 hours 8 minutes 2 seconds  Total Procedure Duration: 0 hours 31 minutes 14 seconds  Findings:                 The entire examined colon appeared normal on direct                            and retroflexion views. Complications:            No immediate complications. Estimated blood loss:                            None. Estimated Blood Loss:     Estimated blood loss: none. Impression:               - The entire examined colon is normal on direct and                            retroflexion views.                           - No polyps or cancers. Recommendation:           - Patient has a contact number available for  emergencies. The signs and symptoms of potential                            delayed complications were discussed with the                            patient. Return to normal activities tomorrow.                            Written discharge instructions were provided to the                            patient.                           - Resume previous diet.                           - Continue present medications.                           - Repeat colonoscopy in 10 years for screening. Rachael Fee, MD 12/02/2019 8:57:56 AM This report has been signed electronically.

## 2019-12-02 NOTE — Progress Notes (Signed)
Temp-JB VS-CW  Pt's states no medical or surgical changes since previsit or office visit.  

## 2019-12-02 NOTE — Patient Instructions (Signed)

## 2019-12-04 ENCOUNTER — Ambulatory Visit: Payer: 59 | Attending: Internal Medicine

## 2019-12-04 ENCOUNTER — Other Ambulatory Visit: Payer: Self-pay

## 2019-12-04 DIAGNOSIS — Z23 Encounter for immunization: Secondary | ICD-10-CM

## 2019-12-04 NOTE — Progress Notes (Signed)
   Covid-19 Vaccination Clinic  Name:  Aaron Ferguson    MRN: [id number] DOB: [birth year]  12/04/2019  Aaron Ferguson was observed post Covid-19 immunization for 15 minutes without incidence. He was provided with Vaccine Information Sheet and instruction to access the V-Safe system.   Aaron Ferguson was instructed to call 911 with any severe reactions post vaccine: Marland Kitchen Difficulty breathing  . Swelling of your face and throat  . A fast heartbeat  . A bad rash all over your body  . Dizziness and weakness    Immunizations Administered    Name Date Dose VIS Date Route   Moderna COVID-19 Vaccine 12/04/2019  9:22 AM 0.5 mL 09/06/2019 Intramuscular   Manufacturer: Moderna   Lot: [id number]   NDC: 56979-480-16

## 2019-12-06 ENCOUNTER — Telehealth: Payer: Self-pay

## 2019-12-06 NOTE — Telephone Encounter (Signed)
  Follow up Call-  Call back number 12/02/2019  Post procedure Call Back phone  # [phone]  Permission to leave phone message Yes  Some recent data might be hidden     Patient questions:  Do you have a fever, pain , or abdominal swelling? No. Pain Score  0 *  Have you tolerated food without any problems? Yes.    Have you been able to return to your normal activities? Yes.    Do you have any questions about your discharge instructions: Diet   No. Medications  No. Follow up visit  No.  Do you have questions or concerns about your Care? No.  Actions: * If pain score is 4 or above: 1. No action needed, pain <4.Have you developed a fever since your procedure? no  2.   Have you had an respiratory symptoms (SOB or cough) since your procedure? no  3.   Have you tested positive for COVID 19 since your procedure no  4.   Have you had any family members/close contacts diagnosed with the COVID 19 since your procedure?  no   If yes to any of these questions please route to Laverna Peace, RN and Jennye Boroughs, Charity fundraiser.

## 2020-01-07 ENCOUNTER — Ambulatory Visit: Payer: 59 | Attending: Internal Medicine

## 2020-01-07 DIAGNOSIS — Z23 Encounter for immunization: Secondary | ICD-10-CM

## 2020-01-07 NOTE — Progress Notes (Signed)
   Covid-19 Vaccination Clinic  Name:  Aaron Ferguson    MRN: [id number] DOB: [birth year]  01/07/2020  Mr. Clayson was observed post Covid-19 immunization for 15 minutes without incident. He was provided with Vaccine Information Sheet and instruction to access the V-Safe system.   Mr. Fillingim was instructed to call 911 with any severe reactions post vaccine: Marland Kitchen Difficulty breathing  . Swelling of face and throat  . A fast heartbeat  . A bad rash all over body  . Dizziness and weakness   Immunizations Administered    Name Date Dose VIS Date Route   Moderna COVID-19 Vaccine 01/07/2020  9:37 AM 0.5 mL 09/06/2019 Intramuscular   Manufacturer: Moderna   Lot: [id number]   NDC: 52415-901-72

## 2020-01-13 ENCOUNTER — Ambulatory Visit (INDEPENDENT_AMBULATORY_CARE_PROVIDER_SITE_OTHER): Payer: 59 | Admitting: Family Medicine

## 2020-01-13 ENCOUNTER — Other Ambulatory Visit: Payer: Self-pay

## 2020-01-13 VITALS — BP 160/92 | HR 83 | Temp 98.2°F | Ht 71.0 in | Wt 242.2 lb

## 2020-01-13 DIAGNOSIS — E1165 Type 2 diabetes mellitus with hyperglycemia: Secondary | ICD-10-CM | POA: Diagnosis not present

## 2020-01-13 DIAGNOSIS — Z125 Encounter for screening for malignant neoplasm of prostate: Secondary | ICD-10-CM | POA: Diagnosis not present

## 2020-01-13 DIAGNOSIS — Z0001 Encounter for general adult medical examination with abnormal findings: Secondary | ICD-10-CM | POA: Diagnosis not present

## 2020-01-13 DIAGNOSIS — Z0000 Encounter for general adult medical examination without abnormal findings: Secondary | ICD-10-CM

## 2020-01-13 LAB — POCT URINALYSIS DIP (MANUAL ENTRY)
Bilirubin, UA: NEGATIVE
Blood, UA: NEGATIVE
Glucose, UA: NEGATIVE mg/dL
Ketones, POC UA: NEGATIVE mg/dL
Leukocytes, UA: NEGATIVE
Nitrite, UA: NEGATIVE
Protein Ur, POC: 30 mg/dL — AB
Spec Grav, UA: >=1.03 — AB (ref 1.010–1.025)
Urobilinogen, UA: 1 E.U./dL
pH, UA: 5.5 (ref 5.0–8.0)

## 2020-01-13 NOTE — Progress Notes (Signed)
Subjective:   Aaron Ferguson is a 59 y.o. Male who presents for an Annual Wellness Visit.  Nail Fungus Reports that he has fungus of the toes and feet bilaterally He states that he would like something to treat his toenails He denies athletes foot or itching   Diabetes Mellitus: Patient presents for follow up of diabetes. Symptoms: hyperglycemia. Symptoms have stabilized. Patient denies hypoglycemia , nausea, paresthesia of the feet, polydipsia and polyuria.  Evaluation to date has been included: hemoglobin A1C.  Home sugars: patient does not check sugars. Treatment to date: Continued metformin which has been effective.  Lab Results  Component Value Date   HGBA1C 6.9 (H) 01/13/2020    Wt Readings from Last 3 Encounters:  01/20/20 238 lb (108 kg)  01/13/20 242 lb 3.2 oz (109.9 kg)  12/02/19 230 lb (104.3 kg)     Patient Active Problem List   Diagnosis Date Noted  . Tinea corporis 01/26/2019  . Tinea versicolor 01/26/2019  . DM (diabetes mellitus) ([place]) 01/10/2013  . Hyperlipidemia 01/09/2013  . BMI 34.0-34.9,adult 01/09/2013  . GERD (gastroesophageal reflux disease) 01/09/2013    Past Medical History:  Diagnosis Date  . Arthritis   . Diabetes mellitus without complication ([place])   . GERD (gastroesophageal reflux disease)   . Hyperlipidemia   . Hypertension      Past Surgical History:  Procedure Laterality Date  . COLONOSCOPY     10 yrs ago  . EYE SURGERY     to fix retinas     Outpatient Medications Prior to Visit  Medication Sig Dispense Refill  . atorvastatin (LIPITOR) 20 MG tablet Take 1 tablet (20 mg total) by mouth daily at 6 PM. 90 tablet 3  . Fexofenadine HCl (ALLEGRA PO) Take by mouth as needed.    . fish oil-omega-3 fatty acids 1000 MG capsule Take 1 g by mouth daily.    Marland Kitchen lisinopril (ZESTRIL) 5 MG tablet Take 1 tablet (5 mg total) by mouth daily. 90 tablet 1  . metFORMIN (GLUCOPHAGE-XR) 500 MG 24 hr tablet Take 2 tablets (1,000 mg total) by  mouth 2 (two) times daily. (Patient taking differently: Take 500 mg by mouth 2 (two) times daily. ) 360 tablet 1   No facility-administered medications prior to visit.    No Known Allergies   Family History  Problem Relation Age of Onset  . Diabetes Mother   . Mental illness Mother   . Diabetes Sister   . Colon cancer Neg Hx   . Esophageal cancer Neg Hx   . Rectal cancer Neg Hx   . Stomach cancer Neg Hx   . Colon polyps Neg Hx      Social History   Socioeconomic History  . Marital status: Married    Spouse name: Joycelyn Schmid  . Number of children: Not on file  . Years of education: Not on file  . Highest education level: Not on file  Occupational History  . Occupation: Estate manager/land agent: [employer]  Tobacco Use  . Smoking status: Former Smoker    Packs/day: 0.50    Types: Cigarettes    Quit date: 10/06/1978    Years since quitting: 41.3  . Smokeless tobacco: Never Used  Substance and Sexual Activity  . Alcohol use: No  . Drug use: No  . Sexual activity: Yes    Partners: Female  Other Topics Concern  . Not on file  Social History Narrative   Lives with wife  Bowls for fun   Social Determinants of Health   Financial Resource Strain:   . Difficulty of Paying Living Expenses:   Food Insecurity:   . Worried About Charity fundraiser in the Last Year:   . Arboriculturist in the Last Year:   Transportation Needs:   . Film/video editor (Medical):   Marland Kitchen Lack of Transportation (Non-Medical):   Physical Activity:   . Days of Exercise per Week:   . Minutes of Exercise per Session:   Stress:   . Feeling of Stress :   Social Connections:   . Frequency of Communication with Friends and Family:   . Frequency of Social Gatherings with Friends and Family:   . Attends Religious Services:   . Active Member of Clubs or Organizations:   . Attends Archivist Meetings:   Marland Kitchen Marital Status:       Recent Hospitalizations? No  Health Habits: Current  exercise activities include: walking Exercise: 3 times/week. Diet: in general, a "healthy" diet    Alcohol intake:   Health Risk Assessment: The patient has completed a Health Risk Assessment. This has been reveiwed with them and has been scanned into the [facility] system as an attached document.  Current Medical Providers and Suppliers: [facility] Patient Care Team: Forrest Moron, MD as PCP - General (Internal Medicine) No future appointments.   Age-appropriate Screening Schedule: The list below includes current immunization status and future screening recommendations based on patient's age. Orders for these recommended tests are listed in the plan section. The patient has been provided with a written plan. Immunization History  Administered Date(s) Administered  . Influenza,inj,Quad PF,6+ Mos 08/04/2018, 07/15/2019  . Influenza-Unspecified 07/19/2014, 07/23/2015, 09/05/2016, 07/13/2017  . Moderna SARS-COVID-2 Vaccination 12/04/2019, 01/07/2020  . Pneumococcal Polysaccharide-23 05/22/2015  . Tdap 05/22/2015    Health Maintenance reviewed -  glycohemoglobin ordered   Depression Screen-PHQ2/9 completed today  Depression screen [facility] 2/9 01/13/2020 07/15/2019 01/04/2019 08/04/2018 02/05/2018  Decreased Interest 0 0 0 0 0  Down, Depressed, Hopeless 0 0 0 0 0  PHQ - 2 Score 0 0 0 0 0       Depression Severity and Treatment Recommendations:  0-4= None  5-9= Mild / Treatment: Support, educate to call if worse; return in one month  10-14= Moderate / Treatment: Support, watchful waiting; Antidepressant or Psycotherapy  15-19= Moderately severe / Treatment: Antidepressant OR Psychotherapy  >= 20 = Major depression, severe / Antidepressant AND Psychotherapy  ROS  Review of Systems  Constitutional: Negative for activity change, appetite change, chills and fever.  HENT: Negative for congestion, nosebleeds, trouble swallowing and voice change.   Respiratory: Negative for cough, shortness of  breath and wheezing.   Gastrointestinal: Negative for diarrhea, nausea and vomiting.  Genitourinary: Negative for difficulty urinating, dysuria, flank pain and hematuria. No LUTS.  Musculoskeletal: Negative for back pain, joint swelling and neck pain.  Neurological: Negative for dizziness, speech difficulty, light-headedness and numbness.   See HPI. All other review of systems negative.    Objective:   Vitals:   01/13/20 0855 01/13/20 0856 01/13/20 0928  BP: (!) 160/97 (!) 156/89 (!) 160/92  Pulse: 83    Temp: 98.2 F (36.8 C)    TempSrc: Temporal    SpO2: 99%    Weight: 242 lb 3.2 oz (109.9 kg)    Height: 5' 11"  (1.803 m)      Body mass index is 33.78 kg/m.  BP (!) 160/92 (BP Location: Left Arm,  Patient Position: Sitting, Cuff Size: Large)   Pulse 83   Temp 98.2 F (36.8 C) (Temporal)   Ht 5' 11"  (1.803 m)   Wt 242 lb 3.2 oz (109.9 kg)   SpO2 99%   BMI 33.78 kg/m   General Appearance:    Alert, cooperative, no distress, appears stated age  Head:    Normocephalic, without obvious abnormality, atraumatic  Eyes:    PERRL, conjunctiva/corneas clear, EOM's intact, fundi    benign, both eyes       Ears:    Normal TM's and external ear canals, both ears  Nose:   Nares normal, septum midline, mucosa normal, no drainage   or sinus tenderness  Throat:   Lips, mucosa, and tongue normal; teeth and gums normal  Neck:   Supple, symmetrical, trachea midline, no adenopathy;       thyroid:  No enlargement/tenderness/nodules; no carotid   bruit or JVD  Back:     Symmetric, no curvature, ROM normal, no CVA tenderness  Lungs:     Clear to auscultation bilaterally, respirations unlabored  Chest wall:    No tenderness or deformity  Heart:    Regular rate and rhythm, S1 and S2 normal, no murmur, rub   or gallop  Abdomen:     Soft, non-tender, bowel sounds active all four quadrants,    no masses, no organomegaly  Genitalia:    Normal male without lesion, discharge or tenderness    Rectal:    Normal tone, normal prostate, no masses or tenderness;   guaiac negative stool  Extremities:   Extremities normal, atraumatic, no cyanosis or edema  Pulses:   2+ and symmetric all extremities  Skin:   Skin color, texture, turgor normal, no rashes or lesions  Lymph nodes:   Cervical, supraclavicular, and axillary nodes normal  Neurologic:   CNII-XII intact. Normal strength, sensation and reflexes      throughout       Assessment/Plan:   Patient Self-Management and Personalized Health Advice The patient has been provided with information about:  continue current medications  During the course of the visit the patient was educated and counseled about appropriate screening and preventive services including:   lab testing as noted in orders section, further lab testing not indicated, return annually or prn     Body mass index is 33.78 kg/m. Discussed the patient's BMI with him. The BMI BMI is not in the acceptable range; BMI management plan is completed  Donel was seen today for annual exam.  Diagnoses and all orders for this visit:  Annual physical exam- Women's Health Maintenance Plan Advised monthly breast exam and annual mammogram Advised dental exam every six months Discussed stress management Discussed pap smear screening guidelines   -     POCT urinalysis dipstick  Type 2 diabetes mellitus with hyperglycemia, without long-term current use of insulin ([place]) - well controlled hemoglobin a1c is at goal Continue exercise Lipids monitored and renal function in range Reviewed diabetic foot care Emphasized importance of eye and dental exam    -     CMP14+EGFR -     TSH -     Lipid panel -     Hemoglobin A1c  Screening for malignant neoplasm of prostate -     PSA      No follow-ups on file.  No future appointments.  Patient Instructions       If you have lab work done today you will be contacted with your lab results  within the next 2  weeks.  If you have not heard from [place] then please contact us. The fastest way to get your results is to register for My Chart.   IF you received an x-ray today, you will receive an invoice from [facility]. Please contact [facility] at [phone] with questions or concerns regarding your invoice.   IF you received labwork today, you will receive an invoice from [facility]. Please contact [facility] at [phone] with questions or concerns regarding your invoice.   Our billing staff will not be able to assist you with questions regarding bills from these companies.  You will be contacted with the lab results as soon as they are available. The fastest way to get your results is to activate your My Chart account. Instructions are located on the last page of this paperwork. If you have not heard from [place] regarding the results in 2 weeks, please contact this office.       An after visit summary with all of these plans was given to the patient.

## 2020-01-13 NOTE — Patient Instructions (Signed)
° ° ° °  If you have lab work done today you will be contacted with your lab results within the next 2 weeks.  If you have not heard from us then please contact us. The fastest way to get your results is to register for My Chart. ° ° °IF you received an x-ray today, you will receive an invoice from [facility]. Please contact [facility] at [phone] with questions or concerns regarding your invoice.  ° °IF you received labwork today, you will receive an invoice from [facility]. Please contact [facility] at [phone] with questions or concerns regarding your invoice.  ° °Our billing staff will not be able to assist you with questions regarding bills from these companies. ° °You will be contacted with the lab results as soon as they are available. The fastest way to get your results is to activate your My Chart account. Instructions are located on the last page of this paperwork. If you have not heard from us regarding the results in 2 weeks, please contact this office. °  ° ° ° °

## 2020-01-14 LAB — CMP14+EGFR
ALT: 53 IU/L — ABNORMAL HIGH (ref 0–44)
AST: 45 IU/L — ABNORMAL HIGH (ref 0–40)
Albumin/Globulin Ratio: 1.6 (ref 1.2–2.2)
Albumin: 4.4 g/dL (ref 3.8–4.9)
Alkaline Phosphatase: 57 IU/L (ref 39–117)
BUN/Creatinine Ratio: 10 (ref 10–24)
BUN: 10 mg/dL (ref 8–27)
Bilirubin Total: 0.6 mg/dL (ref 0.0–1.2)
CO2: 17 mmol/L — ABNORMAL LOW (ref 20–29)
Calcium: 9.5 mg/dL (ref 8.6–10.2)
Chloride: 102 mmol/L (ref 96–106)
Creatinine, Ser: 0.99 mg/dL (ref 0.76–1.27)
GFR calc Af Amer: 95 mL/min/{1.73_m2} (ref >59)
GFR calc non Af Amer: 82 mL/min/{1.73_m2} (ref >59)
Globulin, Total: 2.7 g/dL (ref 1.5–4.5)
Glucose: 119 mg/dL — ABNORMAL HIGH (ref 65–99)
Potassium: 4.2 mmol/L (ref 3.5–5.2)
Sodium: 139 mmol/L (ref 134–144)
Total Protein: 7.1 g/dL (ref 6.0–8.5)

## 2020-01-14 LAB — HEMOGLOBIN A1C
Est. average glucose Bld gHb Est-mCnc: 151 mg/dL
Hgb A1c MFr Bld: 6.9 % — ABNORMAL HIGH (ref 4.8–5.6)

## 2020-01-14 LAB — LIPID PANEL
Chol/HDL Ratio: 3 ratio (ref 0.0–5.0)
Cholesterol, Total: 161 mg/dL (ref 100–199)
HDL: 54 mg/dL (ref >39)
LDL Chol Calc (NIH): 93 mg/dL (ref 0–99)
Triglycerides: 73 mg/dL (ref 0–149)
VLDL Cholesterol Cal: 14 mg/dL (ref 5–40)

## 2020-01-14 LAB — PSA: Prostate Specific Ag, Serum: 0.4 ng/mL (ref 0.0–4.0)

## 2020-01-14 LAB — TSH: TSH: 2.2 u[IU]/mL (ref 0.450–4.500)

## 2020-01-20 ENCOUNTER — Ambulatory Visit (INDEPENDENT_AMBULATORY_CARE_PROVIDER_SITE_OTHER): Payer: 59 | Admitting: Family Medicine

## 2020-01-20 ENCOUNTER — Ambulatory Visit: Payer: 59

## 2020-01-20 ENCOUNTER — Other Ambulatory Visit: Payer: Self-pay

## 2020-01-20 VITALS — BP 139/75 | HR 74 | Temp 98.1°F | Ht 71.0 in | Wt 238.0 lb

## 2020-01-20 DIAGNOSIS — Z0130 Encounter for examination of blood pressure without abnormal findings: Secondary | ICD-10-CM

## 2020-01-20 NOTE — Progress Notes (Signed)
CAME IN FOR BP CHECK and it WAS  139/75 Right arm, HR 74

## 2020-03-20 LAB — HM DIABETES EYE EXAM

## 2020-03-23 ENCOUNTER — Encounter: Payer: Self-pay | Admitting: Family Medicine

## 2021-07-26 ENCOUNTER — Other Ambulatory Visit: Payer: Self-pay

## 2021-07-26 ENCOUNTER — Encounter: Payer: Self-pay | Admitting: Registered Nurse

## 2021-07-26 ENCOUNTER — Ambulatory Visit: Payer: 59 | Admitting: Registered Nurse

## 2021-07-26 VITALS — BP 141/96 | HR 85 | Temp 98.2°F | Ht 71.0 in | Wt 229.2 lb

## 2021-07-26 DIAGNOSIS — E7800 Pure hypercholesterolemia, unspecified: Secondary | ICD-10-CM

## 2021-07-26 DIAGNOSIS — Z23 Encounter for immunization: Secondary | ICD-10-CM | POA: Diagnosis not present

## 2021-07-26 DIAGNOSIS — Z13220 Encounter for screening for lipoid disorders: Secondary | ICD-10-CM | POA: Diagnosis not present

## 2021-07-26 DIAGNOSIS — Z0000 Encounter for general adult medical examination without abnormal findings: Secondary | ICD-10-CM

## 2021-07-26 DIAGNOSIS — E119 Type 2 diabetes mellitus without complications: Secondary | ICD-10-CM | POA: Diagnosis not present

## 2021-07-26 DIAGNOSIS — Z125 Encounter for screening for malignant neoplasm of prostate: Secondary | ICD-10-CM | POA: Diagnosis not present

## 2021-07-26 DIAGNOSIS — Z1329 Encounter for screening for other suspected endocrine disorder: Secondary | ICD-10-CM

## 2021-07-26 DIAGNOSIS — I10 Essential (primary) hypertension: Secondary | ICD-10-CM

## 2021-07-26 DIAGNOSIS — Z130 Encounter for screening for diseases of the blood and blood-forming organs and certain disorders involving the immune mechanism: Secondary | ICD-10-CM

## 2021-07-26 DIAGNOSIS — Z13228 Encounter for screening for other metabolic disorders: Secondary | ICD-10-CM

## 2021-07-26 MED ORDER — METFORMIN HCL ER 500 MG PO TB24: 500.0000 mg | ORAL_TABLET | Freq: Two times a day (BID) | ORAL | 0 refills | Status: DC

## 2021-07-26 MED ORDER — LISINOPRIL 10 MG PO TABS: 10.0000 mg | ORAL_TABLET | Freq: Every day | ORAL | 1 refills | Status: DC

## 2021-07-26 MED ORDER — ATORVASTATIN CALCIUM 20 MG PO TABS: 20.0000 mg | ORAL_TABLET | Freq: Every day | ORAL | 1 refills | Status: DC

## 2021-07-26 NOTE — Progress Notes (Signed)
Established Patient Office Visit  Subjective:  Patient ID: Aaron Ferguson, male    DOB: 02-07-1961  Age: 60 y.o. MRN: [id number]  CC:  Chief Complaint  Patient presents with   Transitions Of Care    Patient states he is here for a TOC and CPE.    HPI Aaron Ferguson presents for CPE  Histories reviewed and updated with patient.   Last A1c:  Lab Results  Component Value Date   HGBA1C 6.2 (H) 07/26/2021    Currently taking: metformin 500mg  ER po bid ac. No new complications Reports good compliance with medications Diet has been improved Exercise habits have been steady  Hypertension: Patient Currently taking: lisinopril 5mg  po qd Good effect. No AEs. Denies CV symptoms including: chest pain, shob, doe, headache, visual changes, fatigue, claudication, and dependent edema.   Previous readings and labs: BP Readings from Last 3 Encounters:  07/26/21 (!) 141/96  01/20/20 139/75  01/13/20 (!) 160/92   Lab Results  Component Value Date   CREATININE 1.05 07/26/2021    Hld assoc with t2dm Taking atorvastatin 20mg  po qd Fish oil 1000mg  po qd No AE Lab Results  Component Value Date   CHOL 145 07/26/2021   HDL 56 07/26/2021   LDLCALC 78 07/26/2021   TRIG 39 07/26/2021   CHOLHDL 2.6 07/26/2021      Past Medical History:  Diagnosis Date   Arthritis    Diabetes mellitus without complication (HCC)    GERD (gastroesophageal reflux disease)    Hyperlipidemia    Hypertension    Vaccinations Due for slu shot and pna today Would like both No AE to vaccines in past.  Past Surgical History:  Procedure Laterality Date   COLONOSCOPY     10 yrs ago   EYE SURGERY     to fix retinas    Family History  Problem Relation Age of Onset   Diabetes Mother    Mental illness Mother    Diabetes Sister    Colon cancer Neg Hx    Esophageal cancer Neg Hx    Rectal cancer Neg Hx    Stomach cancer Neg Hx    Colon polyps Neg Hx     Social History   Socioeconomic  History   Marital status: Married    Spouse name: 07/28/2021   Number of children: Not on file   Years of education: Not on file   Highest education level: Not on file  Occupational History   Occupation: 07/28/2021: [employer]  Tobacco Use   Smoking status: Former    Packs/day: 0.50    Types: Cigarettes    Quit date: 10/06/1978    Years since quitting: 42.8   Smokeless tobacco: Never  Vaping Use   Vaping Use: Never used  Substance and Sexual Activity   Alcohol use: No   Drug use: No   Sexual activity: Yes    Partners: Female  Other Topics Concern   Not on file  Social History Narrative   Lives with wife      Bowls for fun   Social Determinants of Health   Financial Resource Strain: Not on file  Food Insecurity: Not on file  Transportation Needs: Not on file  Physical Activity: Not on file  Stress: Not on file  Social Connections: Not on file  Intimate Partner Violence: Not on file    Outpatient Medications Prior to Visit  Medication Sig Dispense Refill   Fexofenadine HCl (ALLEGRA PO) Take  by mouth as needed.     fish oil-omega-3 fatty acids 1000 MG capsule Take 1 g by mouth daily.     atorvastatin (LIPITOR) 20 MG tablet Take 1 tablet (20 mg total) by mouth daily at 6 PM. 90 tablet 3   lisinopril (ZESTRIL) 5 MG tablet Take 1 tablet (5 mg total) by mouth daily. 90 tablet 1   metFORMIN (GLUCOPHAGE-XR) 500 MG 24 hr tablet Take 2 tablets (1,000 mg total) by mouth 2 (two) times daily. (Patient taking differently: Take 500 mg by mouth 2 (two) times daily.) 360 tablet 1   No facility-administered medications prior to visit.    No Known Allergies  ROS Review of Systems  Constitutional: Negative.   HENT: Negative.    Eyes: Negative.   Respiratory: Negative.    Cardiovascular: Negative.   Gastrointestinal: Negative.   Genitourinary: Negative.   Musculoskeletal: Negative.   Skin: Negative.   Neurological: Negative.   Psychiatric/Behavioral: Negative.     All other systems reviewed and are negative.    Objective:    Physical Exam Constitutional:      General: He is not in acute distress.    Appearance: Normal appearance. He is normal weight. He is not ill-appearing, toxic-appearing or diaphoretic.  Cardiovascular:     Rate and Rhythm: Normal rate and regular rhythm.     Heart sounds: Normal heart sounds. No murmur heard.   No friction rub. No gallop.  Pulmonary:     Effort: Pulmonary effort is normal. No respiratory distress.     Breath sounds: Normal breath sounds. No stridor. No wheezing, rhonchi or rales.  Chest:     Chest wall: No tenderness.  Neurological:     General: No focal deficit present.     Mental Status: He is alert and oriented to person, place, and time. Mental status is at baseline.  Psychiatric:        Mood and Affect: Mood normal.        Behavior: Behavior normal.        Thought Content: Thought content normal.        Judgment: Judgment normal.    BP (!) 141/96   Pulse 85   Temp 98.2 F (36.8 C) (Temporal)   Ht 5\' 11"  (1.803 m)   Wt 229 lb 3.2 oz (104 kg)   SpO2 99%   BMI 31.97 kg/m  Wt Readings from Last 3 Encounters:  07/26/21 229 lb 3.2 oz (104 kg)  01/20/20 238 lb (108 kg)  01/13/20 242 lb 3.2 oz (109.9 kg)     Health Maintenance Due  Topic Date Due   Zoster Vaccines- Shingrix (1 of 2) Never done   COVID-19 Vaccine (3 - Booster for Moderna series) 03/03/2020   FOOT EXAM  07/14/2020   OPHTHALMOLOGY EXAM  03/20/2021    There are no preventive care reminders to display for this patient.  Lab Results  Component Value Date   TSH 2.81 07/26/2021   Lab Results  Component Value Date   WBC 6.4 07/26/2021   HGB 14.1 07/26/2021   HCT 42.0 07/26/2021   MCV 83.0 07/26/2021   PLT 245 07/26/2021   Lab Results  Component Value Date   NA 139 07/26/2021   K 4.3 07/26/2021   CO2 25 07/26/2021   GLUCOSE 99 07/26/2021   BUN 12 07/26/2021   CREATININE 1.05 07/26/2021   BILITOT 1.1 07/26/2021    ALKPHOS 57 01/13/2020   AST 42 (H) 07/26/2021   ALT 42 07/26/2021  PROT 7.0 07/26/2021   ALBUMIN 4.4 01/13/2020   CALCIUM 9.6 07/26/2021   Lab Results  Component Value Date   CHOL 145 07/26/2021   Lab Results  Component Value Date   HDL 56 07/26/2021   Lab Results  Component Value Date   LDLCALC 78 07/26/2021   Lab Results  Component Value Date   TRIG 39 07/26/2021   Lab Results  Component Value Date   CHOLHDL 2.6 07/26/2021   Lab Results  Component Value Date   HGBA1C 6.2 (H) 07/26/2021      Assessment & Plan:   Problem List Items Addressed This Visit       Endocrine   DM (diabetes mellitus) (HCC)   Relevant Medications   lisinopril (ZESTRIL) 10 MG tablet   atorvastatin (LIPITOR) 20 MG tablet   metFORMIN (GLUCOPHAGE-XR) 500 MG 24 hr tablet     Other   Hyperlipidemia   Relevant Medications   lisinopril (ZESTRIL) 10 MG tablet   atorvastatin (LIPITOR) 20 MG tablet   Other Visit Diagnoses     Annual physical exam    -  Primary   Screening PSA (prostate specific antigen)       Relevant Orders   PSA (Completed)   Screening for endocrine, metabolic and immunity disorder       Relevant Orders   CBC with Differential/Platelet (Completed)   Hemoglobin A1c (Completed)   Comprehensive metabolic panel (Completed)   TSH (Completed)   Lipid screening       Relevant Orders   Lipid panel (Completed)   Flu vaccine need       Relevant Orders   Flu Vaccine QUAD 6+ mos PF IM (Fluarix Quad PF) (Completed)   Pneumococcal vaccination administered at current visit       Relevant Orders   Pneumococcal conjugate vaccine 20-valent (Prevnar 20) (Completed)   Primary hypertension       Relevant Medications   lisinopril (ZESTRIL) 10 MG tablet   atorvastatin (LIPITOR) 20 MG tablet       Meds ordered this encounter  Medications   lisinopril (ZESTRIL) 10 MG tablet    Sig: Take 1 tablet (10 mg total) by mouth daily.    Dispense:  90 tablet    Refill:  1     Order Specific Question:   Supervising Provider    Answer:   Neva Seat, JEFFREY R [[id number]]   atorvastatin (LIPITOR) 20 MG tablet    Sig: Take 1 tablet (20 mg total) by mouth daily at 6 PM.    Dispense:  90 tablet    Refill:  1    Order Specific Question:   Supervising Provider    Answer:   Neva Seat, JEFFREY R [[id number]]   metFORMIN (GLUCOPHAGE-XR) 500 MG 24 hr tablet    Sig: Take 1 tablet (500 mg total) by mouth 2 (two) times daily.    Dispense:  180 tablet    Refill:  0    Refill when patient is due.    Order Specific Question:   Supervising Provider    Answer:   Neva Seat, JEFFREY R [[id number]]    Follow-up: No follow-ups on file.   PLAN Htn uncontrolled. Increase lisinopril to 10mg  po qd. Continue metformin dosage. Will adjust dose if A1c above goal of 7.0 Labs collected. Will follow up with the patient as warranted. Exam unremarkable Flu vaccine and prevnar 20 given today Return in 6 mo for chronic conditions Patient encouraged to call clinic with any questions, comments, or concerns.  Maximiano Coss, NP

## 2021-07-26 NOTE — Patient Instructions (Signed)
° ° ° °  If you have lab work done today you will be contacted with your lab results within the next 2 weeks.  If you have not heard from us then please contact us. The fastest way to get your results is to register for My Chart. ° ° °IF you received an x-ray today, you will receive an invoice from [facility]. Please contact [facility] at [phone] with questions or concerns regarding your invoice.  ° °IF you received labwork today, you will receive an invoice from [facility]. Please contact [facility] at [phone] with questions or concerns regarding your invoice.  ° °Our billing staff will not be able to assist you with questions regarding bills from these companies. ° °You will be contacted with the lab results as soon as they are available. The fastest way to get your results is to activate your My Chart account. Instructions are located on the last page of this paperwork. If you have not heard from us regarding the results in 2 weeks, please contact this office. °  ° ° ° °

## 2021-07-27 LAB — COMPREHENSIVE METABOLIC PANEL
AG Ratio: 1.8 (calc) (ref 1.0–2.5)
ALT: 42 U/L (ref 9–46)
AST: 42 U/L — ABNORMAL HIGH (ref 10–35)
Albumin: 4.5 g/dL (ref 3.6–5.1)
Alkaline phosphatase (APISO): 42 U/L (ref 35–144)
BUN: 12 mg/dL (ref 7–25)
CO2: 25 mmol/L (ref 20–32)
Calcium: 9.6 mg/dL (ref 8.6–10.3)
Chloride: 102 mmol/L (ref 98–110)
Creat: 1.05 mg/dL (ref 0.70–1.35)
Globulin: 2.5 g/dL (calc) (ref 1.9–3.7)
Glucose, Bld: 99 mg/dL (ref 65–99)
Potassium: 4.3 mmol/L (ref 3.5–5.3)
Sodium: 139 mmol/L (ref 135–146)
Total Bilirubin: 1.1 mg/dL (ref 0.2–1.2)
Total Protein: 7 g/dL (ref 6.1–8.1)

## 2021-07-27 LAB — CBC WITH DIFFERENTIAL/PLATELET
Absolute Monocytes: 416 cells/uL (ref 200–950)
Basophils Absolute: 58 cells/uL (ref 0–200)
Basophils Relative: 0.9 %
Eosinophils Absolute: 166 cells/uL (ref 15–500)
Eosinophils Relative: 2.6 %
HCT: 42 % (ref 38.5–50.0)
Hemoglobin: 14.1 g/dL (ref 13.2–17.1)
Lymphs Abs: 2342 cells/uL (ref 850–3900)
MCH: 27.9 pg (ref 27.0–33.0)
MCHC: 33.6 g/dL (ref 32.0–36.0)
MCV: 83 fL (ref 80.0–100.0)
MPV: 11.1 fL (ref 7.5–12.5)
Monocytes Relative: 6.5 %
Neutro Abs: 3418 cells/uL (ref 1500–7800)
Neutrophils Relative %: 53.4 %
Platelets: 245 10*3/uL (ref 140–400)
RBC: 5.06 10*6/uL (ref 4.20–5.80)
RDW: 13.5 % (ref 11.0–15.0)
Total Lymphocyte: 36.6 %
WBC: 6.4 10*3/uL (ref 3.8–10.8)

## 2021-07-27 LAB — TSH: TSH: 2.81 mIU/L (ref 0.40–4.50)

## 2021-07-27 LAB — PSA: PSA: 0.31 ng/mL (ref <=4.00)

## 2021-07-27 LAB — HEMOGLOBIN A1C
Hgb A1c MFr Bld: 6.2 % of total Hgb — ABNORMAL HIGH (ref <5.7)
Mean Plasma Glucose: 131 mg/dL
eAG (mmol/L): 7.3 mmol/L

## 2021-07-27 LAB — LIPID PANEL
Cholesterol: 145 mg/dL (ref <200)
HDL: 56 mg/dL (ref >=40)
LDL Cholesterol (Calc): 78 mg/dL (calc)
Non-HDL Cholesterol (Calc): 89 mg/dL (calc) (ref <130)
Total CHOL/HDL Ratio: 2.6 (calc) (ref <5.0)
Triglycerides: 39 mg/dL (ref <150)

## 2021-11-04 ENCOUNTER — Other Ambulatory Visit: Payer: Self-pay | Admitting: Registered Nurse

## 2021-11-04 ENCOUNTER — Other Ambulatory Visit: Payer: Self-pay

## 2021-11-04 ENCOUNTER — Telehealth: Payer: Self-pay

## 2021-11-04 DIAGNOSIS — I10 Essential (primary) hypertension: Secondary | ICD-10-CM

## 2021-11-04 DIAGNOSIS — E7800 Pure hypercholesterolemia, unspecified: Secondary | ICD-10-CM

## 2021-11-04 DIAGNOSIS — E119 Type 2 diabetes mellitus without complications: Secondary | ICD-10-CM

## 2021-11-04 MED ORDER — METFORMIN HCL ER 500 MG PO TB24: 500.0000 mg | ORAL_TABLET | Freq: Two times a day (BID) | ORAL | 0 refills | Status: DC

## 2021-11-04 MED ORDER — ATORVASTATIN CALCIUM 20 MG PO TABS: 20.0000 mg | ORAL_TABLET | Freq: Every day | ORAL | 1 refills | Status: DC

## 2021-11-04 MED ORDER — LISINOPRIL 10 MG PO TABS: 10.0000 mg | ORAL_TABLET | Freq: Every day | ORAL | 1 refills | Status: DC

## 2021-11-04 NOTE — Telephone Encounter (Signed)
Pt is asking if he is to take 2 in am and 2 in pm as he has been or just 1 in am and 2 in pm?  I am happy to send rx if you would clarify dosing for me

## 2021-11-04 NOTE — Telephone Encounter (Signed)
Refilled and informed pt of change

## 2021-11-04 NOTE — Telephone Encounter (Signed)
500mg  XR po bid ac  Thanks,  Rich

## 2021-11-04 NOTE — Telephone Encounter (Signed)
Caller name:Aaron Ferguson  On DPR? :Yes  Call back number:[phone]  Provider they see: Richard  Reason for call:Pt is calling he was not aware that the dosage of his metFORMIN (GLUCOPHAGE-XR) 500 MG 24 hr tablet  was lowered to one in the morning and one at night he was used to taking 2 in the morning and 2 at night. Because he was doing that he run out of medication and needs to see if this could be filled again and fix the dosage or stay at the dosage that Rich lowered it to?   metFORMIN (GLUCOPHAGE-XR) 500 MG 24 hr tablet  [id number] - [place], [place] - [address] [place] [address] [place] [address]

## 2021-11-05 ENCOUNTER — Telehealth: Payer: Self-pay | Admitting: Registered Nurse

## 2021-11-05 ENCOUNTER — Other Ambulatory Visit: Payer: Self-pay | Admitting: Registered Nurse

## 2021-11-05 DIAGNOSIS — E119 Type 2 diabetes mellitus without complications: Secondary | ICD-10-CM

## 2021-11-05 MED ORDER — METFORMIN HCL ER 500 MG PO TB24: 500.0000 mg | ORAL_TABLET | Freq: Two times a day (BID) | ORAL | 1 refills | Status: DC

## 2021-11-05 NOTE — Telephone Encounter (Signed)
I have sent over a new order for pt  Thanks,  Rich

## 2021-11-05 NOTE — Telephone Encounter (Signed)
Caller states Pharmacy states they cannot fill prescription due to insurance not covering it. Dosage was changed. Metformin. States he is almost out, has enough to last through Wednesday.   Please advise

## 2021-11-05 NOTE — Telephone Encounter (Signed)
Patient is calling because he stating that metformin is not covered due to dosage change , but I do not see a change.

## 2021-11-06 NOTE — Telephone Encounter (Signed)
Called patient to let him know that the medication was at the pharmacy.

## 2022-01-20 ENCOUNTER — Ambulatory Visit
Admission: RE | Admit: 2022-01-20 | Discharge: 2022-01-20 | Disposition: A | Discharge status: Home / Self Care | Payer: 59 | Source: Ambulatory Visit | Attending: Family Medicine | Admitting: Family Medicine

## 2022-01-20 VITALS — BP 118/81 | HR 86 | Temp 97.9°F | Resp 18

## 2022-01-20 DIAGNOSIS — Z7984 Long term (current) use of oral hypoglycemic drugs: Secondary | ICD-10-CM

## 2022-01-20 DIAGNOSIS — R631 Polydipsia: Secondary | ICD-10-CM

## 2022-01-20 DIAGNOSIS — R739 Hyperglycemia, unspecified: Secondary | ICD-10-CM

## 2022-01-20 DIAGNOSIS — E1165 Type 2 diabetes mellitus with hyperglycemia: Secondary | ICD-10-CM

## 2022-01-20 DIAGNOSIS — R3589 Other polyuria: Secondary | ICD-10-CM

## 2022-01-20 LAB — POCT URINALYSIS DIP (MANUAL ENTRY)
Glucose, UA: >=1000 mg/dL — AB
Leukocytes, UA: NEGATIVE
Nitrite, UA: NEGATIVE
Protein Ur, POC: 100 mg/dL — AB
Spec Grav, UA: >=1.03 — AB (ref 1.010–1.025)
Urobilinogen, UA: 1 E.U./dL
pH, UA: 5.5 (ref 5.0–8.0)

## 2022-01-20 LAB — POCT FASTING CBG KUC MANUAL ENTRY: POCT Glucose (KUC): 256 mg/dL — AB (ref 70–99)

## 2022-01-20 NOTE — ED Provider Notes (Signed)
?[facility] ? ? ? ?CSN: [id number] ?Arrival date & time: 01/20/22  1740 ? ? ?  ? ?History   ?Chief Complaint ?Chief Complaint  ?Patient presents with  ? Urinary Frequency  ?  Entered by patient  ? ?HPI ?Aaron Ferguson is a 61 y.o. male.  ? ?Presenting today with several week history of urinary frequency and urgency, increased thirst.  Denies fever, chills, dysuria, abdominal pain, nausea vomiting diarrhea.  No known history of urinary tract infections.  History of diabetes, was decreased in his dose of metformin at his appointment in October 2 1 tab twice daily and at that time had excellent sugar control with an A1c of 6.1%.  He does not regularly check his home blood sugars.  Last meal was a sausage egg and cheese biscuit at 7:30 AM this morning. ? ?Past Medical History:  ?Diagnosis Date  ? Arthritis   ? Diabetes mellitus without complication (HCC)   ? GERD (gastroesophageal reflux disease)   ? Hyperlipidemia   ? Hypertension   ? ?Patient Active Problem List  ? Diagnosis Date Noted  ? Tinea corporis 01/26/2019  ? Tinea versicolor 01/26/2019  ? DM (diabetes mellitus) (HCC) 01/10/2013  ? Hyperlipidemia 01/09/2013  ? BMI 34.0-34.9,adult 01/09/2013  ? GERD (gastroesophageal reflux disease) 01/09/2013  ? ?Past Surgical History:  ?Procedure Laterality Date  ? COLONOSCOPY    ? 10 yrs ago  ? EYE SURGERY    ? to fix retinas  ? ? ? ?Home Medications   ? ?Prior to Admission medications   ?Medication Sig Start Date End Date Taking? Authorizing Provider  ?atorvastatin (LIPITOR) 20 MG tablet Take 1 tablet (20 mg total) by mouth daily at 6 PM. 11/04/21   Janeece Agee, NP  ?Fexofenadine HCl (ALLEGRA PO) Take by mouth as needed.    [provider]  ?fish oil-omega-3 fatty acids 1000 MG capsule Take 1 g by mouth daily.    [provider]  ?lisinopril (ZESTRIL) 10 MG tablet Take 1 tablet (10 mg total) by mouth daily. 11/04/21   Janeece Agee, NP  ?metFORMIN (GLUCOPHAGE-XR) 500 MG 24 hr tablet Take 1  tablet (500 mg total) by mouth 2 (two) times daily. 11/05/21   Janeece Agee, NP  ? ?Family History ?Family History  ?Problem Relation Age of Onset  ? Diabetes Mother   ? Mental illness Mother   ? Diabetes Sister   ? Colon cancer Neg Hx   ? Esophageal cancer Neg Hx   ? Rectal cancer Neg Hx   ? Stomach cancer Neg Hx   ? Colon polyps Neg Hx   ? ?Social History ?Social History  ? ?Tobacco Use  ? Smoking status: Former  ?  Packs/day: 0.50  ?  Types: Cigarettes  ?  Quit date: 10/06/1978  ?  Years since quitting: 43.3  ? Smokeless tobacco: Never  ?Vaping Use  ? Vaping Use: Never used  ?Substance Use Topics  ? Alcohol use: No  ? Drug use: No  ? ? ?Allergies   ?Patient has no known allergies. ? ? ?Review of Systems ?Review of Systems ?Per HPI ? ?Physical Exam ?Triage Vital Signs ?ED Triage Vitals  ?Enc Vitals Group  ?   BP 01/20/22 1805 118/81  ?   Pulse Rate 01/20/22 1805 86  ?   Resp 01/20/22 1805 18  ?   Temp 01/20/22 1805 97.9 ?F (36.6 ?C)  ?   Temp src --   ?   SpO2 01/20/22 1805 98 %  ?  Weight --   ?   Height --   ?   Head Circumference --   ?   Peak Flow --   ?   Pain Score 01/20/22 1803 0  ?   Pain Loc --   ?   Pain Edu? --   ?   Excl. in GC? --   ? ?No data found. ? ?Updated Vital Signs ?BP 118/81   Pulse 86   Temp 97.9 ?F (36.6 ?C)   Resp 18   SpO2 98%  ? ?Visual Acuity ?Right Eye Distance:   ?Left Eye Distance:   ?Bilateral Distance:   ? ?Right Eye Near:   ?Left Eye Near:    ?Bilateral Near:    ? ?Physical Exam ?Vitals and nursing note reviewed.  ?Constitutional:   ?   Appearance: Normal appearance.  ?HENT:  ?   Head: Atraumatic.  ?   Mouth/Throat:  ?   Mouth: Mucous membranes are moist.  ?Eyes:  ?   Extraocular Movements: Extraocular movements intact.  ?   Conjunctiva/sclera: Conjunctivae normal.  ?Cardiovascular:  ?   Rate and Rhythm: Normal rate and regular rhythm.  ?Pulmonary:  ?   Effort: Pulmonary effort is normal.  ?   Breath sounds: Normal breath sounds.  ?Abdominal:  ?   General: Bowel sounds are  normal. There is no distension.  ?   Palpations: Abdomen is soft.  ?   Tenderness: There is no abdominal tenderness. There is no right CVA tenderness, left CVA tenderness or guarding.  ?Musculoskeletal:     ?   General: Normal range of motion.  ?   Cervical back: Normal range of motion and neck supple.  ?Skin: ?   General: Skin is warm and dry.  ?Neurological:  ?   General: No focal deficit present.  ?   Mental Status: He is oriented to person, place, and time.  ?Psychiatric:     ?   Mood and Affect: Mood normal.     ?   Thought Content: Thought content normal.     ?   Judgment: Judgment normal.  ? ? ? ?UC Treatments / Results  ?Labs ?(all labs ordered are listed, but only abnormal results are displayed) ?Labs Reviewed  ?POCT URINALYSIS DIP (MANUAL ENTRY) - Abnormal; Notable for the following components:  ?    Result Value  ? Glucose, UA >=1,000 (*)   ? Bilirubin, UA small (*)   ? Ketones, POC UA small (15) (*)   ? Spec Grav, UA >=1.030 (*)   ? Blood, UA moderate (*)   ? Protein Ur, POC =100 (*)   ? All other components within normal limits  ?POCT FASTING [facility]G [facility] MANUAL ENTRY - Abnormal; Notable for the following components:  ? POCT Glucose ([facility]) 256 (*)   ? All other components within normal limits  ? ? ?EKG ? ? ?Radiology ?No results found. ? ?Procedures ?Procedures (including critical care time) ? ?Medications Ordered in UC ?Medications - No data to display ? ?Initial Impression / Assessment and Plan / UC Course  ?I have reviewed the triage vital signs and the nursing notes. ? ?Pertinent labs & imaging results that were available during my care of the patient were reviewed by me and considered in my medical decision making (see chart for details). ? ?  ? ?urinalysis without evidence of urinary tract infection but with significant glucose, fingerstick glucose showing 256 which is a fasting reading as his last meal was almost 12 hours  ago.  Discussed to increase back to 2 metformin tablets twice daily until he can  see his PCP which is scheduled for Thursday of this week.  Discussed tight diet control, close monitoring of home blood sugars and symptoms.  Return for any worsening symptoms. ? ?Final Clinical Impressions(s) / UC Diagnoses  ? ?Final diagnoses:  ?Hyperglycemia  ?Polyuria  ?Polydipsia  ? ? ? ?Discharge Instructions   ? ?  ?Start taking the metformin 2 tabs in the morning and 2 tabs at bedtime, follow-up with your primary care later this week as scheduled. ? ? ? ?ED Prescriptions   ?None ?  ? ?PDMP not reviewed this encounter. ?  ?Particia Nearing, PA-C ?01/20/22 1931 ? ?

## 2022-01-20 NOTE — ED Triage Notes (Signed)
Pt presents with c/o urinary frequency and urgency for past couple weeks  ?

## 2022-01-20 NOTE — Discharge Instructions (Signed)
Start taking the metformin 2 tabs in the morning and 2 tabs at bedtime, follow-up with your primary care later this week as scheduled. ?

## 2022-01-23 ENCOUNTER — Ambulatory Visit: Payer: Self-pay | Admitting: Registered Nurse

## 2022-01-27 ENCOUNTER — Encounter: Payer: Self-pay | Admitting: Registered Nurse

## 2022-01-27 ENCOUNTER — Ambulatory Visit (INDEPENDENT_AMBULATORY_CARE_PROVIDER_SITE_OTHER): Payer: 59 | Admitting: Registered Nurse

## 2022-01-27 ENCOUNTER — Telehealth: Payer: Self-pay

## 2022-01-27 VITALS — BP 130/90 | HR 79 | Temp 98.0°F | Resp 18 | Ht 71.0 in | Wt 227.5 lb

## 2022-01-27 DIAGNOSIS — E119 Type 2 diabetes mellitus without complications: Secondary | ICD-10-CM

## 2022-01-27 LAB — POCT GLYCOSYLATED HEMOGLOBIN (HGB A1C): Hemoglobin A1C: 12.9 % — AB (ref 4.0–5.6)

## 2022-01-27 MED ORDER — EMPAGLIFLOZIN 25 MG PO TABS
25.0000 mg | ORAL_TABLET | Freq: Every day | ORAL | 0 refills | Status: DC
Start: 2022-01-27 — End: 2022-04-22

## 2022-01-27 MED ORDER — METFORMIN HCL 1000 MG PO TABS: 1000.0000 mg | ORAL_TABLET | Freq: Two times a day (BID) | ORAL | 3 refills | Status: AC

## 2022-01-27 NOTE — Progress Notes (Signed)
? ?Established Patient Office Visit ? ?Subjective:  ?Patient ID: Aaron Ferguson, male    DOB: 26-Apr-1961  Age: 61 y.o. MRN: [id number] ? ?CC:  ?Chief Complaint  ?Patient presents with  ? Hyperglycemia  ?  Started feeling dizzy on yesterday, was seen in ER on 01/20/2022 ?Still having some frequent urination  ? Headache  ?  Started today ?  ? ? ?HPI ?LOKESH FAAS presents for high sugars, headaches.  ? ?High sugars ?Presented to ER with polyuria and polydipsia  ?Was increased to metformin 1000mg  po bid ac ?Doing better since in regards to polydipsia and polyuria. ?Does admit he was eating more sweets lately.  ? ?Headaches/Dizziness ?Ongoing yesterday and today. ?Notes sugars up to 285 today despite increased dose of metformin.  ?Some frontal headaches. Bilateral ?No other neuro or cognitive changes. ?No LOC, chest pain, palpitations, claudication, dependent edema.  ? ?Outpatient Medications Prior to Visit  ?Medication Sig Dispense Refill  ? atorvastatin (LIPITOR) 20 MG tablet Take 1 tablet (20 mg total) by mouth daily at 6 PM. 90 tablet 1  ? Fexofenadine HCl (ALLEGRA PO) Take by mouth as needed.    ? fish oil-omega-3 fatty acids 1000 MG capsule Take 1 g by mouth daily.    ? lisinopril (ZESTRIL) 10 MG tablet Take 1 tablet (10 mg total) by mouth daily. 90 tablet 1  ? metFORMIN (GLUCOPHAGE-XR) 500 MG 24 hr tablet Take 1 tablet (500 mg total) by mouth 2 (two) times daily. (Patient taking differently: Take 500 mg by mouth 2 (two) times daily.) 180 tablet 1  ? ?No facility-administered medications prior to visit.  ? ? ?Review of Systems  ?Constitutional: Negative.   ?HENT: Negative.    ?Eyes: Negative.   ?Respiratory: Negative.    ?Cardiovascular: Negative.   ?Gastrointestinal: Negative.   ?Genitourinary: Negative.   ?Musculoskeletal: Negative.   ?Skin: Negative.   ?Neurological: Negative.   ?Psychiatric/Behavioral: Negative.    ?All other systems reviewed and are negative. ? ?  ?Objective:  ?  ? ?BP 130/90   Pulse 79    Temp 98 ?F (36.7 ?C) (Temporal)   Resp 18   Ht 5\' 11"  (1.803 m)   Wt 227 lb 8 oz (103.2 kg)   SpO2 100%   BMI 31.73 kg/m?  ? ?Wt Readings from Last 3 Encounters:  ?01/27/22 227 lb 8 oz (103.2 kg)  ?07/26/21 229 lb 3.2 oz (104 kg)  ?01/20/20 238 lb (108 kg)  ? ?Physical Exam ?Constitutional:   ?   General: He is not in acute distress. ?   Appearance: Normal appearance. He is normal weight. He is not ill-appearing, toxic-appearing or diaphoretic.  ?Eyes:  ?   General: No visual field deficit. ?Cardiovascular:  ?   Rate and Rhythm: Normal rate and regular rhythm.  ?   Heart sounds: Normal heart sounds. No murmur heard. ?  No friction rub. No gallop.  ?Pulmonary:  ?   Effort: Pulmonary effort is normal. No respiratory distress.  ?   Breath sounds: Normal breath sounds. No stridor. No wheezing, rhonchi or rales.  ?Chest:  ?   Chest wall: No tenderness.  ?Neurological:  ?   General: No focal deficit present.  ?   Mental Status: He is alert and oriented to person, place, and time. Mental status is at baseline.  ?   GCS: GCS eye subscore is 4. GCS verbal subscore is 5. GCS motor subscore is 6.  ?   Cranial Nerves: No cranial nerve deficit,  dysarthria or facial asymmetry.  ?   Sensory: No sensory deficit.  ?   Motor: No weakness.  ?   Coordination: Romberg sign negative. Coordination normal.  ?   Gait: Gait normal.  ?Psychiatric:     ?   Mood and Affect: Mood normal.     ?   Behavior: Behavior normal.     ?   Thought Content: Thought content normal.     ?   Judgment: Judgment normal.  ? ? ?Results for orders placed or performed in visit on 01/27/22  ?POCT HgB A1C  ?Result Value Ref Range  ? Hemoglobin A1C 12.9 (A) 4.0 - 5.6 %  ? HbA1c POC (<> result, manual entry)    ? HbA1c, POC (prediabetic range)    ? HbA1c, POC (controlled diabetic range)    ? ? ? ? ?The 10-year ASCVD risk score (Arnett DK, et al., 2019) is: 23.6% ? ?  ?Assessment & Plan:  ? ?Problem List Items Addressed This Visit   ? ?  ? Endocrine  ? DM (diabetes  mellitus) ([place]) - Primary  ? Relevant Medications  ? empagliflozin (JARDIANCE) 25 MG TABS tablet  ? metFORMIN (GLUCOPHAGE) 1000 MG tablet  ? Other Relevant Orders  ? POCT HgB A1C (Completed)  ? ? ?Meds ordered this encounter  ?Medications  ? empagliflozin (JARDIANCE) 25 MG TABS tablet  ?  Sig: Take 1 tablet (25 mg total) by mouth daily before breakfast.  ?  Dispense:  90 tablet  ?  Refill:  0  ?  Order Specific Question:   Supervising Provider  ?  Answer:   Carlota Raspberry, JEFFREY R [[id number]]  ? metFORMIN (GLUCOPHAGE) 1000 MG tablet  ?  Sig: Take 1 tablet (1,000 mg total) by mouth 2 (two) times daily with a meal.  ?  Dispense:  180 tablet  ?  Refill:  3  ?  Order Specific Question:   Supervising Provider  ?  Answer:   Carlota Raspberry, JEFFREY R [[id number]]  ? ? ?Return in about 6 weeks (around 03/10/2022) for Chronic Conditions.  ? ?PLAN ?Brief neuro exam unremarkable ?Reviewed lab findings ?Metofrmin and jardiance as above, recheck in 6 weeks. ?Return sooner if headaches worsen ?Patient encouraged to call clinic with any questions, comments, or concerns. ? ?Maximiano Coss, NP ?

## 2022-01-27 NOTE — Patient Instructions (Addendum)
Aaron Ferguson -  ? ?Great to see you ? ?Start Jardiance 25 mg once daily in the morning. ? ?Continue metformin - take 1 tablet (1000mg ) twice daily (breakfast and dinner) ? ?Let's recheck in 6 weeks ? ?Thanks, ? ?Rich  ?

## 2022-01-27 NOTE — Telephone Encounter (Signed)
Seen and made a plan ?If we could call pt to ensure he has 6 week follow up , that would be great ? ?Thanks, ? ?Rich

## 2022-01-27 NOTE — Telephone Encounter (Signed)
Nurse Assessment ?Nurse: Rolena Infante, RN, [place] Date/Time (Eastern Time): 01/27/2022 9:17:14 AM ?Confirm and document reason for call. If symptomatic, describe symptoms. ?---Caller states high BS of 285 and having some dizziness. Symptoms started yesterday. Has appt which got cancelled. Went to UC on Thursday for increased urination. BS 257. Causing the problem. Ongoing dizziness this am No other symptoms.  ? ?Does the patient have any new or worsening symptoms? ---Yes ?Will a triage be completed? ---Yes ?Related visit to physician within the last 2 weeks? ---Yes ?Does the PT have any chronic conditions? (i.e. diabetes, asthma, this includes High risk factors for pregnancy, etc.) ---Yes ?List chronic conditions. ---Diabetes, ?Is this a behavioral health or substance abuse call? ---No ? ?Dizziness - Lightheadedness ?[1] MODERATE dizziness (e.g., interferes with normal activities) ?[2] has NOT been evaluated by physician for this (Exception: dizziness caused by heat exposure, sudden standing, or poor fluid intake) ?Rolena Infante, RN, [place] 01/27/2022 9:21:14 AM ? ?01/27/2022 9:27:22 AM See PCP within 24 Hours Yes Rolena Infante, Therapist, sports, [place] ? ?Patient has an appointment today at 3:50 pm. ? ? ?

## 2022-01-28 NOTE — Telephone Encounter (Signed)
Wonderful ? ?Thanks, ? ?Rich

## 2022-01-28 NOTE — Telephone Encounter (Signed)
Scheduled patient for 6 week follow up and he wanted me to let you know that the medication that was sent in was covered and he was able to get it.  ?

## 2022-01-31 ENCOUNTER — Other Ambulatory Visit: Payer: Self-pay | Admitting: Registered Nurse

## 2022-02-04 ENCOUNTER — Other Ambulatory Visit: Payer: Self-pay

## 2022-02-04 ENCOUNTER — Other Ambulatory Visit: Payer: Self-pay | Admitting: Registered Nurse

## 2022-03-11 ENCOUNTER — Ambulatory Visit (INDEPENDENT_AMBULATORY_CARE_PROVIDER_SITE_OTHER): Payer: 59 | Admitting: Registered Nurse

## 2022-03-11 ENCOUNTER — Encounter: Payer: Self-pay | Admitting: Registered Nurse

## 2022-03-11 VITALS — BP 128/76 | HR 85 | Temp 97.9°F | Resp 16 | Ht 71.0 in | Wt 223.6 lb

## 2022-03-11 DIAGNOSIS — E119 Type 2 diabetes mellitus without complications: Secondary | ICD-10-CM

## 2022-03-11 LAB — COMPREHENSIVE METABOLIC PANEL
ALT: 27 U/L (ref 0–53)
AST: 28 U/L (ref 0–37)
Albumin: 4.6 g/dL (ref 3.5–5.2)
Alkaline Phosphatase: 43 U/L (ref 39–117)
BUN: 14 mg/dL (ref 6–23)
CO2: 27 mEq/L (ref 19–32)
Calcium: 9.8 mg/dL (ref 8.4–10.5)
Chloride: 101 mEq/L (ref 96–112)
Creatinine, Ser: 1.12 mg/dL (ref 0.40–1.50)
GFR: 70.25 mL/min (ref >60.00)
Glucose, Bld: 93 mg/dL (ref 70–99)
Potassium: 4.6 mEq/L (ref 3.5–5.1)
Sodium: 136 mEq/L (ref 135–145)
Total Bilirubin: 1 mg/dL (ref 0.2–1.2)
Total Protein: 7.8 g/dL (ref 6.0–8.3)

## 2022-03-11 LAB — HEMOGLOBIN A1C: Hgb A1c MFr Bld: 9.5 % — ABNORMAL HIGH (ref 4.6–6.5)

## 2022-03-11 NOTE — Progress Notes (Signed)
Established Patient Office Visit  Subjective:  Patient ID: Aaron Ferguson, male    DOB: 1961/03/09  Age: 61 y.o. MRN: [id number]  CC:  Chief Complaint  Patient presents with   Diabetes    6 week follow up    HPI VINAYAK BOBIER presents for t2dm   6w follow up after ER follow up for dizziness  Last A1c:  Lab Results  Component Value Date   HGBA1C 12.9 (A) 01/27/2022    Currently taking: metformin 1000mg  po bid ac, jardiance 25mg  po qd No new complications Reports good compliance with medications Diet has been improved Exercise habits have been stable - active job  Feeling much better. No ongoing dizziness. No AE from meds.   Outpatient Medications Prior to Visit  Medication Sig Dispense Refill   atorvastatin (LIPITOR) 20 MG tablet Take 1 tablet (20 mg total) by mouth daily at 6 PM. 90 tablet 1   empagliflozin (JARDIANCE) 25 MG TABS tablet Take 1 tablet (25 mg total) by mouth daily before breakfast. 90 tablet 0   Fexofenadine HCl (ALLEGRA PO) Take by mouth as needed.     fish oil-omega-3 fatty acids 1000 MG capsule Take 1 g by mouth daily.     lisinopril (ZESTRIL) 10 MG tablet Take 1 tablet (10 mg total) by mouth daily. 90 tablet 1   metFORMIN (GLUCOPHAGE) 1000 MG tablet Take 1 tablet (1,000 mg total) by mouth 2 (two) times daily with a meal. 180 tablet 3   metFORMIN (GLUCOPHAGE-XR) 500 MG 24 hr tablet TAKE 1 TABLET BY MOUTH TWICE A DAY 180 tablet 0   No facility-administered medications prior to visit.    Review of Systems  Constitutional: Negative.   HENT: Negative.    Eyes: Negative.   Respiratory: Negative.    Cardiovascular: Negative.   Gastrointestinal: Negative.   Genitourinary: Negative.   Musculoskeletal: Negative.   Skin: Negative.   Neurological: Negative.   Psychiatric/Behavioral: Negative.    All other systems reviewed and are negative.    Objective:     BP 128/76   Pulse 85   Temp 97.9 F (36.6 C) (Temporal)   Resp 16   Ht 5\' 11"  (1.803  m)   Wt 223 lb 9.6 oz (101.4 kg)   SpO2 99%   BMI 31.19 kg/m   Wt Readings from Last 3 Encounters:  03/11/22 223 lb 9.6 oz (101.4 kg)  01/27/22 227 lb 8 oz (103.2 kg)  07/26/21 229 lb 3.2 oz (104 kg)   Physical Exam Constitutional:      General: He is not in acute distress.    Appearance: Normal appearance. He is normal weight. He is not ill-appearing, toxic-appearing or diaphoretic.  Cardiovascular:     Rate and Rhythm: Normal rate and regular rhythm.     Heart sounds: Normal heart sounds. No murmur heard.   No friction rub. No gallop.  Pulmonary:     Effort: Pulmonary effort is normal. No respiratory distress.     Breath sounds: Normal breath sounds. No stridor. No wheezing, rhonchi or rales.  Chest:     Chest wall: No tenderness.  Neurological:     General: No focal deficit present.     Mental Status: He is alert and oriented to person, place, and time. Mental status is at baseline.  Psychiatric:        Mood and Affect: Mood normal.        Behavior: Behavior normal.        Thought  Content: Thought content normal.        Judgment: Judgment normal.    No results found for any visits on 03/11/22.    The 10-year ASCVD risk score (Arnett DK, et al., 2019) is: 23%    Assessment & Plan:   Problem List Items Addressed This Visit       Endocrine   DM (diabetes mellitus) (HCC) - Primary    Check labs. Adjust medication as indicated. Follow up in 3 mo for CPE and labs. Enc pt to call sooner if need arises.        Relevant Orders   Comprehensive metabolic panel   Hemoglobin A1c    No orders of the defined types were placed in this encounter.   Return in about 3 months (around 06/11/2022) for CPE and labs.    Janeece Agee, NP

## 2022-03-11 NOTE — Assessment & Plan Note (Signed)
Check labs. Adjust medication as indicated. Follow up in 3 mo for CPE and labs. Enc pt to call sooner if need arises.

## 2022-03-11 NOTE — Patient Instructions (Signed)
Mr. Mcneill -   Aaron Ferguson to see you  Glad you're feeling better. Let's keep that momentum going  I'll call with results  See you in 3 months for a physical and labs. Sooner if concerns arise.  Thanks,  Luan Pulling

## 2022-04-22 ENCOUNTER — Other Ambulatory Visit: Payer: Self-pay | Admitting: Registered Nurse

## 2022-04-22 DIAGNOSIS — E119 Type 2 diabetes mellitus without complications: Secondary | ICD-10-CM

## 2022-04-22 DIAGNOSIS — E7800 Pure hypercholesterolemia, unspecified: Secondary | ICD-10-CM

## 2022-05-06 ENCOUNTER — Other Ambulatory Visit: Payer: Self-pay

## 2022-05-06 ENCOUNTER — Telehealth: Payer: Self-pay | Admitting: Registered Nurse

## 2022-05-06 DIAGNOSIS — I10 Essential (primary) hypertension: Secondary | ICD-10-CM

## 2022-05-06 DIAGNOSIS — E119 Type 2 diabetes mellitus without complications: Secondary | ICD-10-CM

## 2022-05-06 MED ORDER — LISINOPRIL 10 MG PO TABS: 10.0000 mg | ORAL_TABLET | Freq: Every day | ORAL | 1 refills | Status: AC

## 2022-05-06 MED ORDER — EMPAGLIFLOZIN 25 MG PO TABS: 25.0000 mg | ORAL_TABLET | Freq: Every day | ORAL | 0 refills | Status: AC

## 2022-05-06 NOTE — Telephone Encounter (Signed)
Encourage patient to contact the pharmacy for refills or they can request refills through [facility]  (Please schedule appointment if patient has not been seen in over a year)    WHAT PHARMACY WOULD THEY LIKE THIS SENT TO: [facility] [phone]  MEDICATION NAME & DOSE: Jardiance 25 mg AND lisinopril 10 mg for 3 mo  NOTES/COMMENTS FROM PATIENT: pls let pt know once called in      Front office please notify patient: It takes 48-72 hours to process rx refill requests Ask patient to call pharmacy to ensure rx is ready before heading there.

## 2022-05-06 NOTE — Telephone Encounter (Signed)
Refills have been sent to pharmacy °

## 2022-06-18 ENCOUNTER — Ambulatory Visit
Admission: RE | Admit: 2022-06-18 | Discharge: 2022-06-18 | Disposition: A | Discharge status: Home / Self Care | Payer: 59 | Source: Ambulatory Visit | Attending: Family Medicine | Admitting: Family Medicine

## 2022-06-18 VITALS — BP 129/74 | HR 87 | Temp 98.7°F | Resp 18

## 2022-06-18 DIAGNOSIS — J0100 Acute maxillary sinusitis, unspecified: Secondary | ICD-10-CM

## 2022-06-18 DIAGNOSIS — R062 Wheezing: Secondary | ICD-10-CM

## 2022-06-18 MED ORDER — AMOXICILLIN-POT CLAVULANATE 875-125 MG PO TABS: 1.0000 | ORAL_TABLET | Freq: Two times a day (BID) | ORAL | 0 refills | Status: AC

## 2022-06-18 MED ORDER — ALBUTEROL SULFATE HFA 108 (90 BASE) MCG/ACT IN AERS
1.0000 | INHALATION_SPRAY | Freq: Four times a day (QID) | RESPIRATORY_TRACT | 0 refills | Status: AC | PRN
Start: 2022-06-18 — End: ?

## 2022-06-18 NOTE — ED Triage Notes (Signed)
Pt present nasal and chest congestion, symptom started three weeks ago. Pt tried OTC medication with no relief.

## 2022-06-19 NOTE — ED Provider Notes (Signed)
[facility]    CSN: [id number] Arrival date & time: 06/18/22  1144      History   Chief Complaint Chief Complaint  Patient presents with   Nasal Congestion    Wheezing - Entered by patient    HPI Aaron Ferguson is a 61 y.o. male.   Presenting today with 3-week history of nasal congestion, sinus pain and pressure, productive cough and chest congestion.  Denies fever, chills, body aches, abdominal pain, nausea vomiting or diarrhea.  Now also having some wheezing with his coughing, worse at night.  Has been trying Coricidin HBP with minimal relief.  No known sick contacts recently.  No known history of chronic pulmonary disease.    Past Medical History:  Diagnosis Date   Arthritis    Diabetes mellitus without complication (HCC)    GERD (gastroesophageal reflux disease)    Hyperlipidemia    Hypertension     Patient Active Problem List   Diagnosis Date Noted   Tinea corporis 01/26/2019   Tinea versicolor 01/26/2019   DM (diabetes mellitus) (HCC) 01/10/2013   Hyperlipidemia 01/09/2013   BMI 34.0-34.9,adult 01/09/2013   GERD (gastroesophageal reflux disease) 01/09/2013    Past Surgical History:  Procedure Laterality Date   COLONOSCOPY     10 yrs ago   EYE SURGERY     to fix retinas       Home Medications    Prior to Admission medications   Medication Sig Start Date End Date Taking? Authorizing Provider  albuterol (VENTOLIN HFA) 108 (90 Base) MCG/ACT inhaler Inhale 1-2 puffs into the lungs every 6 (six) hours as needed for wheezing or shortness of breath. 06/18/22  Yes Particia Nearing, PA-C  amoxicillin-clavulanate (AUGMENTIN) 875-125 MG tablet Take 1 tablet by mouth every 12 (twelve) hours. 06/18/22  Yes Particia Nearing, PA-C  atorvastatin (LIPITOR) 20 MG tablet TAKE 1 TABLET BY MOUTH DAILY AT 6 PM. 04/22/22   Janeece Agee, NP  empagliflozin (JARDIANCE) 25 MG TABS tablet Take 1 tablet (25 mg total) by mouth daily before breakfast.  05/06/22   Janeece Agee, NP  Fexofenadine HCl (ALLEGRA PO) Take by mouth as needed.    [provider]  fish oil-omega-3 fatty acids 1000 MG capsule Take 1 g by mouth daily.    [provider]  lisinopril (ZESTRIL) 10 MG tablet Take 1 tablet (10 mg total) by mouth daily. 05/06/22   Janeece Agee, NP  metFORMIN (GLUCOPHAGE) 1000 MG tablet Take 1 tablet (1,000 mg total) by mouth 2 (two) times daily with a meal. 01/27/22   Janeece Agee, NP    Family History Family History  Problem Relation Age of Onset   Diabetes Mother    Mental illness Mother    Diabetes Sister    Colon cancer Neg Hx    Esophageal cancer Neg Hx    Rectal cancer Neg Hx    Stomach cancer Neg Hx    Colon polyps Neg Hx     Social History Social History   Tobacco Use   Smoking status: Former    Packs/day: 0.50    Types: Cigarettes    Quit date: 10/06/1978    Years since quitting: 43.7   Smokeless tobacco: Never  Vaping Use   Vaping Use: Never used  Substance Use Topics   Alcohol use: No   Drug use: No     Allergies   Patient has no known allergies.   Review of Systems Review of Systems Per HPI  Physical  Exam Triage Vital Signs ED Triage Vitals  Enc Vitals Group     BP 06/18/22 1151 129/74     Pulse Rate 06/18/22 1151 87     Resp 06/18/22 1151 18     Temp 06/18/22 1151 98.7 F (37.1 C)     Temp Source 06/18/22 1151 Oral     SpO2 06/18/22 1151 95 %     Weight --      Height --      Head Circumference --      Peak Flow --      Pain Score 06/18/22 1150 0     Pain Loc --      Pain Edu? --      Excl. in GC? --    No data found.  Updated Vital Signs BP 129/74 (BP Location: Left Arm)   Pulse 87   Temp 98.7 F (37.1 C) (Oral)   Resp 18   SpO2 95%   Visual Acuity Right Eye Distance:   Left Eye Distance:   Bilateral Distance:    Right Eye Near:   Left Eye Near:    Bilateral Near:     Physical Exam Vitals and nursing note reviewed.  Constitutional:       Appearance: Normal appearance.  HENT:     Head: Atraumatic.     Nose: Congestion present.     Mouth/Throat:     Mouth: Mucous membranes are moist.     Pharynx: No oropharyngeal exudate or posterior oropharyngeal erythema.  Eyes:     Extraocular Movements: Extraocular movements intact.     Conjunctiva/sclera: Conjunctivae normal.  Cardiovascular:     Rate and Rhythm: Normal rate and regular rhythm.     Heart sounds: Normal heart sounds.  Pulmonary:     Effort: Pulmonary effort is normal.     Breath sounds: Wheezing present. No rales.     Comments: Trace wheezes bilaterally Musculoskeletal:        General: Normal range of motion.     Cervical back: Normal range of motion and neck supple.  Skin:    General: Skin is warm and dry.  Neurological:     General: No focal deficit present.     Mental Status: He is oriented to person, place, and time.  Psychiatric:        Mood and Affect: Mood normal.        Thought Content: Thought content normal.        Judgment: Judgment normal.      UC Treatments / Results  Labs (all labs ordered are listed, but only abnormal results are displayed) Labs Reviewed - No data to display  EKG   Radiology No results found.  Procedures Procedures (including critical care time)  Medications Ordered in UC Medications - No data to display  Initial Impression / Assessment and Plan / UC Course  I have reviewed the triage vital signs and the nursing notes.  Pertinent labs & imaging results that were available during my care of the patient were reviewed by me and considered in my medical decision making (see chart for details).     Treat for sinusitis with Augmentin, albuterol for the wheezing as needed.  Discussed potentially starting an allergy regimen to see if this is what is causing his ongoing and progressively worsening symptoms.  Return for any worsening symptoms.  Final Clinical Impressions(s) / UC Diagnoses   Final diagnoses:  Acute  maxillary sinusitis, recurrence not specified  Wheezing   Discharge  Instructions   None    ED Prescriptions     Medication Sig Dispense Auth. Provider   amoxicillin-clavulanate (AUGMENTIN) 875-125 MG tablet Take 1 tablet by mouth every 12 (twelve) hours. 20 tablet Particia Nearing, [place]   albuterol (VENTOLIN HFA) 108 (90 Base) MCG/ACT inhaler Inhale 1-2 puffs into the lungs every 6 (six) hours as needed for wheezing or shortness of breath. 18 g Particia Nearing, [place]      PDMP not reviewed this encounter.   Particia Nearing, [place] 06/19/22 [phone]
